# Patient Record
Sex: Female | Born: 1985
Health system: Southern US, Community
[De-identification: ages and names within clinical notes are randomized; demographics above are authoritative.]

## PROBLEM LIST (undated history)

## (undated) DIAGNOSIS — R87629 Unspecified abnormal cytological findings in specimens from vagina: Secondary | ICD-10-CM

## (undated) DIAGNOSIS — D649 Anemia, unspecified: Secondary | ICD-10-CM

## (undated) DIAGNOSIS — R112 Nausea with vomiting, unspecified: Secondary | ICD-10-CM

## (undated) DIAGNOSIS — Z9889 Other specified postprocedural states: Secondary | ICD-10-CM

## (undated) HISTORY — DX: Anemia, unspecified: D64.9

## (undated) HISTORY — PX: ANTERIOR CRUCIATE LIGAMENT REPAIR: SHX115

## (undated) HISTORY — DX: Unspecified abnormal cytological findings in specimens from vagina: R87.629

---

## 2020-03-13 NOTE — L&D Delivery Note (Signed)
PROCEDURE DATE: 10/12/2020   PREOPERATIVE DIAGNOSIS: [redacted]w[redacted]d, PROM, IUGR, breech   POSTOPERATIVE DIAGNOSIS: The same   PROCEDURE:    PrimaryLow Transverse Cesarean Section   SURGEON:  Dr. Austin Kilie Rund   INDICATIONS: This is a 35yo G1P0 at 37 wga requiring cesarean section secondary to ruptured membranes and breech presentation.  Pregnancy also notable for IUGR with normal doppler studies. Decision made to proceed with LTCS. The risks of cesarean section discussed with the patient included but were not limited to: bleeding which may require transfusion or reoperation; infection which may require antibiotics; injury to bowel, bladder, ureters or other surrounding organs; injury to the fetus; need for additional procedures including hysterectomy in the event of a life-threatening hemorrhage; placental abnormalities wth subsequent pregnancies, incisional problems, thromboembolic phenomenon and other postoperative/anesthesia complications. The patient agreed with the proposed plan, giving informed consent for the procedure.     FINDINGS:  Viable female infant in breech presentation, APGARspend,  Weight pending, Amniotic fluid clear,  Intact placenta, three vessel cord.  Grossly normal uterus. .   ANESTHESIA:    Epidural ESTIMATED BLOOD LOSS: 500 cc SPECIMENS: Placenta for pathology (IUGR) COMPLICATIONS: None immediate    PROCEDURE IN DETAIL:  The patient received intravenous antibiotics (2g Ancef) and had sequential compression devices applied to her lower extremities while in the preoperative area.  She was then taken to the operating room where epidural anesthesia was dosed up to surgical level and was found to be adequate. She was then placed in a dorsal supine position with a leftward tilt, and prepped and draped in a sterile manner.  A foley catheter was placed into her bladder and attached to constant gravity.  After an adequate timeout was performed, a Pfannenstiel skin incision was made with  scalpel and carried through to the underlying layer of fascia. The fascia was incised in the midline and this incision was extended bilaterally using the Mayo scissors. Kocher clamps were applied to the superior aspect of the fascial incision and the underlying rectus muscles were dissected off bluntly. A similar process was carried out on the inferior aspect of the facial incision. The rectus muscles were separated in the midline bluntly and the peritoneum was entered bluntly.  A bladder flap was created sharply and developed bluntly. A transverse hysterotomy was made with a scalpel and extended bilaterally bluntly. The bladder blade was then removed. The infant was successfully delivered, and cord was clamped and cut and infant was handed over to awaiting neonatology team. Uterine massage was then administered and the placenta delivered intact with three-vessel cord. Cord gases were taken. The uterus was cleared of clot and debris.  The hysterotomy was closed with 0 monocryl.  A second imbricating suture of 0-monocryl was used to reinforce the incision and aid in hemostasis.The fascia was closed with 0-Vicryl in a running fashion with good restoration of anatomy.  The subcutaneus tissue was irrigated and was reapproximated using three interrupted plain gut stitches.  The skin was closed with 4-0 Vicryl in a subcuticular fashion.  All surgical site and was hemostatic at end of procedure without any further bleeding on exam.    Pt tolerated the procedure well. All sponge/lap/needle counts were correct  X 2. Pt taken to recovery room in stable condition.   Austin Ario Mcdiarmid MD  

## 2020-03-26 ENCOUNTER — Other Ambulatory Visit (HOSPITAL_COMMUNITY): Payer: Self-pay | Admitting: Obstetrics and Gynecology

## 2020-03-26 DIAGNOSIS — D649 Anemia, unspecified: Secondary | ICD-10-CM | POA: Insufficient documentation

## 2020-03-26 DIAGNOSIS — N911 Secondary amenorrhea: Secondary | ICD-10-CM | POA: Diagnosis not present

## 2020-03-26 MED FILL — PROMETHAZINE 25 MG TABLET: 25 | 10 days supply | Qty: 60 | Fill #0

## 2020-03-26 MED FILL — DOXYLAMINE-PYRIDOXINE 10-10: 10-10 | 30 days supply | Qty: 60 | Fill #0

## 2020-04-08 DIAGNOSIS — Z3685 Encounter for antenatal screening for Streptococcus B: Secondary | ICD-10-CM | POA: Diagnosis not present

## 2020-04-08 DIAGNOSIS — Z3481 Encounter for supervision of other normal pregnancy, first trimester: Secondary | ICD-10-CM | POA: Diagnosis not present

## 2020-04-08 DIAGNOSIS — Z31430 Encounter of female for testing for genetic disease carrier status for procreative management: Secondary | ICD-10-CM | POA: Diagnosis not present

## 2020-04-19 DIAGNOSIS — Z113 Encounter for screening for infections with a predominantly sexual mode of transmission: Secondary | ICD-10-CM | POA: Diagnosis not present

## 2020-04-19 DIAGNOSIS — Z3400 Encounter for supervision of normal first pregnancy, unspecified trimester: Secondary | ICD-10-CM | POA: Diagnosis not present

## 2020-04-19 DIAGNOSIS — Z01419 Encounter for gynecological examination (general) (routine) without abnormal findings: Secondary | ICD-10-CM | POA: Diagnosis not present

## 2020-04-27 DIAGNOSIS — Z3A13 13 weeks gestation of pregnancy: Secondary | ICD-10-CM | POA: Diagnosis not present

## 2020-04-27 DIAGNOSIS — Z3481 Encounter for supervision of other normal pregnancy, first trimester: Secondary | ICD-10-CM | POA: Diagnosis not present

## 2020-04-27 DIAGNOSIS — Z3682 Encounter for antenatal screening for nuchal translucency: Secondary | ICD-10-CM | POA: Diagnosis not present

## 2020-05-24 DIAGNOSIS — M84375A Stress fracture, left foot, initial encounter for fracture: Secondary | ICD-10-CM | POA: Diagnosis not present

## 2020-05-25 ENCOUNTER — Encounter: Payer: Self-pay | Admitting: Family Medicine

## 2020-05-25 ENCOUNTER — Ambulatory Visit (INDEPENDENT_AMBULATORY_CARE_PROVIDER_SITE_OTHER): Payer: 59 | Admitting: Family Medicine

## 2020-05-25 ENCOUNTER — Other Ambulatory Visit: Payer: Self-pay

## 2020-05-25 ENCOUNTER — Ambulatory Visit: Payer: Self-pay

## 2020-05-25 VITALS — BP 108/62 | HR 60 | Ht <= 58 in | Wt 116.0 lb

## 2020-05-25 DIAGNOSIS — M79672 Pain in left foot: Secondary | ICD-10-CM | POA: Diagnosis not present

## 2020-05-25 NOTE — Patient Instructions (Signed)
Post-op shoe until I see you again 4000-5000IU of vitamin d Arnica lotion to the foot Ice after activity for 20 minutes Continue compression socks Tylenol for pain relief  See me in 2-3 weeks (ok to double book)

## 2020-05-25 NOTE — Progress Notes (Signed)
[facility] [address] [place] [address] Phone: [phone] Subjective:   [name], am serving as a scribe for Dr. Antoine Primas. This visit occurred during the SARS-CoV-2 public health emergency.  Safety protocols were in place, including screening questions prior to the visit, additional usage of staff PPE, and extensive cleaning of exam room while observing appropriate contact time as indicated for disinfecting solutions.   I'm seeing this patient by the request  of:  Patient, No Pcp Per  CC: Left foot pain  [id number]  Debra Sawyer is a 35 y.o. female coming in with complaint of left foot injury. Patent was running on treadmill and felt sharp pain on top of left foot one week ago. Soreness with planter flexion over lateral aspect. Patient was on feet over the weekend and pain seemed to increase. Had xray yesterday and was put in a boot due to pain. No history of foot pain. Patient works out 7 days a week. Was doing 4-8 miles on treadmill. Is pregnant and has to run to be able to be less constipated.  Patient has not taken anything for pain.  Past surgical history includes for ACL repair    Social History   Socioeconomic History  . Marital status: Married    Spouse name: Not on file  . Number of children: Not on file  . Years of education: Not on file  . Highest education level: Not on file  Occupational History  . Not on file  Tobacco Use  . Smoking status: Not on file  . Smokeless tobacco: Not on file  Substance and Sexual Activity  . Alcohol use: Not on file  . Drug use: Not on file  . Sexual activity: Not on file  Other Topics Concern  . Not on file  Social History Narrative  . Not on file   Social Determinants of Health   Financial Resource Strain: Not on file  Food Insecurity: Not on file  Transportation Needs: Not on file  Physical Activity: Not on file  Stress: Not on file  Social Connections: Not on  file   Allergies  Allergen Reactions  . Sulfa Antibiotics Rash   No family history on file. No current outpatient medications on file.   Reviewed prior external information including notes and imaging from  primary care provider As well as notes that were available from care everywhere and other healthcare systems.  Past medical history, social, surgical and family history all reviewed in electronic medical record.  No pertanent information unless stated regarding to the chief complaint.   Review of Systems:  No headache, visual changes, nausea, vomiting, diarrhea, constipation, dizziness, abdominal pain, skin rash, fevers, chills, night sweats, weight loss, swollen lymph nodes, body aches,, chest pain, shortness of breath, mood changes. POSITIVE muscle aches mild joint swelling  Objective  Blood pressure 108/62, pulse 60, height 1' (0.305 m), weight 116 lb (52.6 kg), SpO2 98 %.   General: No apparent distress alert and oriented x3 mood and affect normal, dressed appropriately.  HEENT: Pupils equal, extraocular movements intact  Respiratory: Patient's speak in full sentences and does not appear short of breath  Cardiovascular: No lower extremity edema, non tender, no erythema  Gait antalgic gait walking in a long boot. MSK: Left foot exam shows patient does have very mild amount of fluid on the dorsal aspect of the foot.  Severely tender over the third metatarsal.  No pain in the midfoot.  Neurovascularly intact  distally.  Patient on the plantar aspect of the foot has significant number of callus.  Patient does have 1 about the area of the third foot proximally at the transverse arch.  Large callus formation noted over the first and second on the plantar aspect of the foot.  Mild bunion formation noted.  Limited musculoskeletal ultrasound was performed and interpreted by Judi Saa  Limited ultrasound of patient's left dorsal foot does not show any significant cortical irregularity  of the third or fourth metatarsal bones.  Patient does have some soft tissue swelling and mild enlargement of the nerve between the third and fourth but no true neuroma noted.  Mild increase in Doppler flow in the area.  Seems to be compressible today. Impression: Nonspecific soft tissue inflammation between the third and fourth metatarsals.    Impression and Recommendations:     The above documentation has been reviewed and is accurate and complete Judi Saa, DO

## 2020-05-25 NOTE — Assessment & Plan Note (Addendum)
Patient does have dorsal pain of the foot.  Patient does have significant callus formation on the bottom foot.  Could be potentially a stress reaction.  On ultrasound I do not see any true stress reaction the patient does have soft tissue swelling noted over the third and fourth metatarsals.  Patient is pregnant and could have some laxity of the joint that could be contributing.  I do not see any midfoot difficulties noted either in the Lisfranc area.  Patient was put in an postop shoe and did have significant improvement from patient's long cam walker that she was able from outside facility.  Not have x-rays but we will hold at this time.  Patient will follow up with me again in 2 to 3 weeks.  Encourage vitamin D supplementation.

## 2020-05-27 ENCOUNTER — Ambulatory Visit: Payer: Self-pay | Admitting: Family Medicine

## 2020-06-02 DIAGNOSIS — Z3A18 18 weeks gestation of pregnancy: Secondary | ICD-10-CM | POA: Diagnosis not present

## 2020-06-02 DIAGNOSIS — Z363 Encounter for antenatal screening for malformations: Secondary | ICD-10-CM | POA: Diagnosis not present

## 2020-06-02 DIAGNOSIS — Z3402 Encounter for supervision of normal first pregnancy, second trimester: Secondary | ICD-10-CM | POA: Diagnosis not present

## 2020-06-09 NOTE — Progress Notes (Signed)
[name] [address] [place] [address] Phone: [phone] Subjective:   I Debra Sawyer am serving as a Neurosurgeon for Dr. Antoine Primas.  This visit occurred during the SARS-CoV-2 public health emergency.  Safety protocols were in place, including screening questions prior to the visit, additional usage of staff PPE, and extensive cleaning of exam room while observing appropriate contact time as indicated for disinfecting solutions.   I'm seeing this patient by the request  of:  Patient, No Pcp Per (Inactive)  CC: Left foot pain follow-up  [id number]   05/25/2020 Patient does have dorsal pain of the foot.  Patient does have significant callus formation on the bottom foot.  Could be potentially a stress reaction.  On ultrasound I do not see any true stress reaction the patient does have soft tissue swelling noted over the third and fourth metatarsals.  Patient is pregnant and could have some laxity of the joint that could be contributing.  I do not see any midfoot difficulties noted either in the Lisfranc area.  Patient was put in an postop shoe and did have significant improvement from patient's long cam walker that she was able from outside facility.  Not have x-rays but we will hold at this time.  Patient will follow up with me again in 2 to 3 weeks.  Encourage vitamin D supplementation.  Update 06/10/2020 Debra Sawyer is a 35 y.o. female coming in with complaint of left foot pain.  Patient is pregnant.  Was found to have likely a stress reaction third and fourth metatarsal heads. States she is doing better but not 100%. About 50% better. Can't walk without a shoe. A lot of pain with walking. With a shoe on it is bearable. States she limps with a shoe off.      No past medical history on file. No past surgical history on file. Social History   Socioeconomic History  . Marital status: Married    Spouse name: Not on file  . Number of  children: Not on file  . Years of education: Not on file  . Highest education level: Not on file  Occupational History  . Not on file  Tobacco Use  . Smoking status: Not on file  . Smokeless tobacco: Not on file  Substance and Sexual Activity  . Alcohol use: Not on file  . Drug use: Not on file  . Sexual activity: Not on file  Other Topics Concern  . Not on file  Social History Narrative  . Not on file   Social Determinants of Health   Financial Resource Strain: Not on file  Food Insecurity: Not on file  Transportation Needs: Not on file  Physical Activity: Not on file  Stress: Not on file  Social Connections: Not on file   Allergies  Allergen Reactions  . Sulfa Antibiotics Rash   No family history on file. No current outpatient medications on file.   Reviewed prior external information including notes and imaging from  primary care provider As well as notes that were available from care everywhere and other healthcare systems.  Past medical history, social, surgical and family history all reviewed in electronic medical record.  No pertanent information unless stated regarding to the chief complaint.   Review of Systems:  No headache, visual changes, nausea, vomiting, diarrhea, constipation, dizziness, abdominal pain, skin rash, fevers, chills, night sweats, weight loss, swollen lymph nodes, body aches, joint swelling, chest pain, shortness of breath, mood changes.  POSITIVE muscle aches  Objective  Blood pressure 100/62, pulse 70, height 5\' 2"  (1.575 m), weight 116 lb (52.6 kg), SpO2 100 %.   General: No apparent distress alert and oriented x3 mood and affect normal, dressed appropriately.  HEENT: Pupils equal, extraocular movements intact  Respiratory: Patient's speak in full sentences and does not appear short of breath  Cardiovascular: No lower extremity edema, non tender, no erythema  Gait normal with good balance and coordination.  MSK: Patient is gravid Foot  exam shows that patient is less tender than before but still has some pain over the fourth and third metatarsal proximally. Mild positive squeeze test noted.  Limited musculoskeletal ultrasound was performed and interpreted by  Unable to save pictures Patient does have what appears to be improvement over the cortical irregularity that was noted before.  Patient though does have what appears to be mild enlargement of the nerve between the third and fourth toe that is more than previous exam. Impression: Reactive neuroma with reabsorption of the cortical irregularity previously seen.   Impression and Recommendations:     The above documentation has been reviewed and is accurate and complete Judi Saa, DO

## 2020-06-10 ENCOUNTER — Encounter: Payer: Self-pay | Admitting: Family Medicine

## 2020-06-10 ENCOUNTER — Other Ambulatory Visit: Payer: Self-pay

## 2020-06-10 ENCOUNTER — Ambulatory Visit (INDEPENDENT_AMBULATORY_CARE_PROVIDER_SITE_OTHER): Payer: 59 | Admitting: Family Medicine

## 2020-06-10 DIAGNOSIS — M79672 Pain in left foot: Secondary | ICD-10-CM | POA: Diagnosis not present

## 2020-06-10 NOTE — Assessment & Plan Note (Signed)
Patient is already found out that she is having some reabsorption noted on ultrasound today.  Patient is unfortunately probably getting a reactive neuroma.  I would like patients of bone to be fully healed before we do injection before patient goes to [place].  We will see patient again in 2 to 3 weeks to see how patient is responding and consider injection if necessary.

## 2020-06-10 NOTE — Patient Instructions (Addendum)
OOOFOS sandals Continue Vit D Getting a neuroma which we can injection See me in 2-3 weeks

## 2020-06-29 NOTE — Progress Notes (Signed)
[name] [address] [place] [address] Phone: [phone] Subjective:   [name], am serving as a scribe for Dr. Antoine Primas. This visit occurred during the SARS-CoV-2 public health emergency.  Safety protocols were in place, including screening questions prior to the visit, additional usage of staff PPE, and extensive cleaning of exam room while observing appropriate contact time as indicated for disinfecting solutions.   I'm seeing this patient by the request  of:  Patient, No Pcp Per (Inactive)  CC: Foot pain follow-up  [id number]   06/10/2020 Patient is already found out that she is having some reabsorption noted on ultrasound today.  Patient is unfortunately probably getting a reactive neuroma.  I would like patients of bone to be fully healed before we do injection before patient goes to [place].  We will see patient again in 2 to 3 weeks to see how patient is responding and consider injection if necessary.  Update 06/30/2020 Debra Sawyer is a 35 y.o. female coming in with complaint of left foot pain. Patient states that she is improving. Still having pain barefoot. Less pani with a shoe. Overall improving though.  Patient does states that it is still not good enough that patient is not without some type of discomfort.  Not using any medications regularly though.      No past medical history on file. No past surgical history on file. Social History   Socioeconomic History  . Marital status: Married    Spouse name: Not on file  . Number of children: Not on file  . Years of education: Not on file  . Highest education level: Not on file  Occupational History  . Not on file  Tobacco Use  . Smoking status: Not on file  . Smokeless tobacco: Not on file  Substance and Sexual Activity  . Alcohol use: Not on file  . Drug use: Not on file  . Sexual activity: Not on file  Other Topics Concern  . Not on file  Social  History Narrative  . Not on file   Social Determinants of Health   Financial Resource Strain: Not on file  Food Insecurity: Not on file  Transportation Needs: Not on file  Physical Activity: Not on file  Stress: Not on file  Social Connections: Not on file   Allergies  Allergen Reactions  . Sulfa Antibiotics Rash   No family history on file.    Current Outpatient Medications (Respiratory):  .  promethazine (PHENERGAN) 25 MG tablet, TAKE 1 TABLET BY MOUTH EVERY 4 HOURS    Current Outpatient Medications (Other):  Marland Kitchen  Doxylamine-Pyridoxine 10-10 MG TBEC, TAKE 2 TABLETS BY MOUTH DAILY   Reviewed prior external information including notes and imaging from  primary care provider As well as notes that were available from care everywhere and other healthcare systems.  Past medical history, social, surgical and family history all reviewed in electronic medical record.  No pertanent information unless stated regarding to the chief complaint.   Review of Systems:  No headache, visual changes, nausea, vomiting, diarrhea, constipation, dizziness, abdominal pain, skin rash, fevers, chills, night sweats, weight loss, swollen lymph nodes, body aches, joint swelling, chest pain, shortness of breath, mood changes. POSITIVE muscle aches  Objective  Blood pressure 110/60, pulse 63, height 5\' 2"  (1.575 m), weight 119 lb (54 kg), SpO2 98 %.   General: No apparent distress alert and oriented x3 mood and affect normal, dressed appropriately.  Patient is  gravid  HEENT: Pupils equal, extraocular movements intact  Respiratory: Patient's speak in full sentences and does not appear short of breath  Cardiovascular: No lower extremity edema, non tender, no erythema  Gait normal with good balance and coordination.  MSK: Left foot examination of breakdown of the transverse arch noted.  Positive squeeze test noted.  Still some pain over the fourth and third metatarsal bones himself but only really the  third.   Procedure: Real-time Ultrasound Guided Injection of left foot neuroma Device: GE Logiq Q7 Ultrasound guided injection is preferred based studies that show increased duration, increased effect, greater accuracy, decreased procedural pain, increased response rate, and decreased cost with ultrasound guided versus blind injection.  Verbal informed consent obtained.  Time-out conducted.  Noted no overlying erythema, induration, or other signs of local infection.  Skin prepped in a sterile fashion.  Local anesthesia: Topical Ethyl chloride.  With sterile technique and under real time ultrasound guidance: With a 25-gauge half inch needle injected with 0.5 cc of 0.5% Marcaine and 0.5 cc of Kenalog 40 mg/mL Completed without difficulty  Pain immediately improved suggesting accurate placement of the medication.  Advised to call if fevers/chills, erythema, induration, drainage, or persistent bleeding.  Impression: Technically successful ultrasound guided injection.   Impression and Recommendations:     The above documentation has been reviewed and is accurate and complete Judi Saa, DO

## 2020-06-30 ENCOUNTER — Ambulatory Visit: Payer: Self-pay

## 2020-06-30 ENCOUNTER — Encounter: Payer: Self-pay | Admitting: Family Medicine

## 2020-06-30 ENCOUNTER — Ambulatory Visit (INDEPENDENT_AMBULATORY_CARE_PROVIDER_SITE_OTHER): Payer: 59 | Admitting: Family Medicine

## 2020-06-30 ENCOUNTER — Other Ambulatory Visit: Payer: Self-pay

## 2020-06-30 VITALS — BP 110/60 | HR 63 | Ht 62.0 in | Wt 119.0 lb

## 2020-06-30 DIAGNOSIS — G5782 Other specified mononeuropathies of left lower limb: Secondary | ICD-10-CM

## 2020-06-30 DIAGNOSIS — M79672 Pain in left foot: Secondary | ICD-10-CM

## 2020-06-30 NOTE — Assessment & Plan Note (Signed)
Discussed with patient at great length before the procedure.  Patient is pregnant and did not want to take the potential risk with this.  Patient has failed all other conservative therapy.  Patient did have a stress reaction that seems to be doing much better but now the neuroma seems to be giving her more of a discomfort.  Patient will continue to wear the shoes as well as the carbon fiber plate.  Patient will be traveling.  We discussed the potential for any redness or any inflammation to consider the possibility of medication such as antibiotic.  Patient states she will call if she notices anything.  Patient will follow up with me again in 6 weeks to further evaluate to make sure she is doing well.

## 2020-06-30 NOTE — Patient Instructions (Signed)
Injected neuroma today Have a great trip As long as things get better, can run at beginning of May 2x a week for 2 weeks  Then 3x a week for 3 weeks See me again in 6 weeks

## 2020-07-15 ENCOUNTER — Encounter: Payer: Self-pay | Admitting: Family Medicine

## 2020-08-05 DIAGNOSIS — Z3682 Encounter for antenatal screening for nuchal translucency: Secondary | ICD-10-CM | POA: Diagnosis not present

## 2020-08-05 DIAGNOSIS — Z23 Encounter for immunization: Secondary | ICD-10-CM | POA: Diagnosis not present

## 2020-08-05 DIAGNOSIS — Z3400 Encounter for supervision of normal first pregnancy, unspecified trimester: Secondary | ICD-10-CM | POA: Diagnosis not present

## 2020-08-06 NOTE — Progress Notes (Signed)
[facility] [address] [place] [address] Phone: [phone] Subjective:   Debra Sawyer, am serving as a scribe for Dr. Antoine Sawyer.\This visit occurred during the SARS-CoV-2 public health emergency.  Safety protocols were in place, including screening questions prior to the visit, additional usage of staff PPE, and extensive cleaning of exam room while observing appropriate contact time as indicated for disinfecting solutions. \ I'm seeing this patient by the request  of:  Patient, No Pcp Per (Inactive)  CC: Foot pain follow-up  SHF:[id number]   06/30/2020 Discussed with patient at great length before the procedure.  Patient is pregnant and did not want to take the potential risk with this.  Patient has failed all other conservative therapy.  Patient did have a stress reaction that seems to be doing much better but now the neuroma seems to be giving her more of a discomfort.  Patient will continue to wear the shoes as well as the carbon fiber plate.  Patient will be traveling.  We discussed the potential for any redness or any inflammation to consider the possibility of medication such as antibiotic.  Patient states she will call if she notices anything.  Patient will follow up with me again in 6 weeks to further evaluate to make sure she is doing well.  Update 08/10/2020 Debra Sawyer is a 35 y.o. female coming in with complaint of L foot pain.  Patient was seen previously and did have a neuroma of the left foot.  Did do an injection April 20.  Patient states that her foot is better.   Feeling pain in L medial longitudinal. Pain with running or is prolonged walking. Pain has increased with firm shoes.      No past medical history on file. No past surgical history on file. Social History   Socioeconomic History  . Marital status: Married    Spouse name: Not on file  . Number of children: Not on file  . Years of education: Not on file  .  Highest education level: Not on file  Occupational History  . Not on file  Tobacco Use  . Smoking status: Not on file  . Smokeless tobacco: Not on file  Substance and Sexual Activity  . Alcohol use: Not on file  . Drug use: Not on file  . Sexual activity: Not on file  Other Topics Concern  . Not on file  Social History Narrative  . Not on file   Social Determinants of Health   Financial Resource Strain: Not on file  Food Insecurity: Not on file  Transportation Needs: Not on file  Physical Activity: Not on file  Stress: Not on file  Social Connections: Not on file   Allergies  Allergen Reactions  . Sulfa Antibiotics Rash   No family history on file.    Current Outpatient Medications (Respiratory):  .  promethazine (PHENERGAN) 25 MG tablet, TAKE 1 TABLET BY MOUTH EVERY 4 HOURS    Current Outpatient Medications (Other):  Marland Kitchen  Doxylamine-Pyridoxine 10-10 MG TBEC, TAKE 2 TABLETS BY MOUTH DAILY   Reviewed prior external information including notes and imaging from  primary care provider As well as notes that were available from care everywhere and other healthcare systems.  Past medical history, social, surgical and family history all reviewed in electronic medical record.  No pertanent information unless stated regarding to the chief complaint.   Review of Systems:  No headache, visual changes, nausea, vomiting, diarrhea, constipation, dizziness,  abdominal pain, skin rash, fevers, chills, night sweats, weight loss, swollen lymph nodes, body aches, joint swelling, chest pain, shortness of breath, mood changes.   Objective  Blood pressure (!) 104/58, pulse 69, height 5\' 2"  (1.575 m), weight 121 lb (54.9 kg), SpO2 99 %.   General: No apparent distress alert and oriented x3 mood and affect normal, dressed appropriately.  Patient is gravid HEENT: Pupils equal, extraocular movements intact  Respiratory: Patient's speak in full sentences and does not appear short of breath   Cardiovascular: No lower extremity edema, non tender, no erythema  Gait appears that patient does have some mild weakness noted of the hip abductor with patient's left knee and crossing midline with her stride.  Patient also does not pushoff on the left foot secondary to likely compensation from her foot previously.  This is noted in the midfoot. MSK:  Foot exam shows patient does have some mild breakdown of the transverse arch.  Patient does have very mild overpronation on the right foot.  Patient does have very mild limited range of motion of the ankle noted left greater than right.  Patient's neurovascular intact distally.  Significant amount of discomfort with compression of the foot.  Limited musculoskeletal ultrasound was performed and interpreted by  Limited ultrasound shows the patient has very mild hypoechoic changes of the midfoot noted.  No significant cortical irregularity noted.  Patient's neuroma is significantly smaller than previous exam with significant decrease in hypoechoic changes surrounding the surrounding area. Impression: Interval improvement of the neuroma with nonspecific findings of the midfoot   Impression and Recommendations:     The above documentation has been reviewed and is accurate and complete Debra Saa, DO

## 2020-08-10 ENCOUNTER — Encounter: Payer: Self-pay | Admitting: Family Medicine

## 2020-08-10 ENCOUNTER — Ambulatory Visit (INDEPENDENT_AMBULATORY_CARE_PROVIDER_SITE_OTHER): Payer: 59 | Admitting: Family Medicine

## 2020-08-10 ENCOUNTER — Ambulatory Visit: Payer: Self-pay

## 2020-08-10 ENCOUNTER — Other Ambulatory Visit: Payer: Self-pay

## 2020-08-10 VITALS — BP 104/58 | HR 69 | Ht 62.0 in | Wt 121.0 lb

## 2020-08-10 DIAGNOSIS — M79672 Pain in left foot: Secondary | ICD-10-CM | POA: Diagnosis not present

## 2020-08-10 DIAGNOSIS — G5782 Other specified mononeuropathies of left lower limb: Secondary | ICD-10-CM | POA: Diagnosis not present

## 2020-08-10 NOTE — Assessment & Plan Note (Signed)
Seems significantly improved at this time.  Do not feel that a repeat injection would be necessary.  Continued for patient to be active we discussed potentially still more biking than anything else.  Follow-up again in 6 weeks

## 2020-08-10 NOTE — Patient Instructions (Signed)
Go back to regular shoes during the day Wear HOKA for running Try to push thru with running Ankle strengthening Update me in 2 weeks See me in 6 weeks

## 2020-08-10 NOTE — Assessment & Plan Note (Signed)
I believe the patient's left foot pain is secondary to more of some longitudinal breakdown.  Patient is pregnant.  We discussed with patient about icing regimen and home exercises.  Patient has been wearing the rigid soled shoes all day and encouraged her to switch to regular shoes during her work and go to more of the carbon fiber plate and running shoes with just running.  Patient given home exercises for more of the hip abductors as well as some for the ankle.  Patient is not pushing off on the ankle at the moment.  Discussed icing regimen and home exercises.  Follow-up with me again in 6 weeks

## 2020-09-06 ENCOUNTER — Other Ambulatory Visit (HOSPITAL_COMMUNITY): Payer: Self-pay | Admitting: Obstetrics and Gynecology

## 2020-09-06 DIAGNOSIS — R1900 Intra-abdominal and pelvic swelling, mass and lump, unspecified site: Secondary | ICD-10-CM

## 2020-09-07 ENCOUNTER — Other Ambulatory Visit (HOSPITAL_COMMUNITY): Payer: Self-pay | Admitting: Obstetrics and Gynecology

## 2020-09-07 DIAGNOSIS — R1900 Intra-abdominal and pelvic swelling, mass and lump, unspecified site: Secondary | ICD-10-CM

## 2020-09-15 ENCOUNTER — Other Ambulatory Visit: Payer: Self-pay

## 2020-09-15 ENCOUNTER — Ambulatory Visit (HOSPITAL_COMMUNITY)
Admission: RE | Admit: 2020-09-15 | Discharge: 2020-09-15 | Disposition: A | Discharge status: Home / Self Care | Payer: 59 | Source: Ambulatory Visit | Attending: Obstetrics and Gynecology | Admitting: Obstetrics and Gynecology

## 2020-09-15 DIAGNOSIS — R1900 Intra-abdominal and pelvic swelling, mass and lump, unspecified site: Secondary | ICD-10-CM | POA: Diagnosis not present

## 2020-09-15 DIAGNOSIS — R1909 Other intra-abdominal and pelvic swelling, mass and lump: Secondary | ICD-10-CM | POA: Diagnosis not present

## 2020-09-21 DIAGNOSIS — Z3A34 34 weeks gestation of pregnancy: Secondary | ICD-10-CM | POA: Diagnosis not present

## 2020-09-21 DIAGNOSIS — O26843 Uterine size-date discrepancy, third trimester: Secondary | ICD-10-CM | POA: Diagnosis not present

## 2020-09-21 DIAGNOSIS — Z3400 Encounter for supervision of normal first pregnancy, unspecified trimester: Secondary | ICD-10-CM | POA: Diagnosis not present

## 2020-09-24 ENCOUNTER — Other Ambulatory Visit: Payer: Self-pay | Admitting: Obstetrics and Gynecology

## 2020-09-24 DIAGNOSIS — Z3682 Encounter for antenatal screening for nuchal translucency: Secondary | ICD-10-CM | POA: Diagnosis not present

## 2020-09-24 DIAGNOSIS — O99891 Other specified diseases and conditions complicating pregnancy: Secondary | ICD-10-CM | POA: Diagnosis not present

## 2020-09-24 DIAGNOSIS — Z363 Encounter for antenatal screening for malformations: Secondary | ICD-10-CM

## 2020-09-24 DIAGNOSIS — Z3A34 34 weeks gestation of pregnancy: Secondary | ICD-10-CM | POA: Diagnosis not present

## 2020-09-24 DIAGNOSIS — Z3400 Encounter for supervision of normal first pregnancy, unspecified trimester: Secondary | ICD-10-CM | POA: Diagnosis not present

## 2020-09-27 DIAGNOSIS — Z3A34 34 weeks gestation of pregnancy: Secondary | ICD-10-CM | POA: Diagnosis not present

## 2020-09-27 DIAGNOSIS — O365999 Maternal care for other known or suspected poor fetal growth, unspecified trimester, other fetus: Secondary | ICD-10-CM | POA: Diagnosis not present

## 2020-09-27 DIAGNOSIS — Z3400 Encounter for supervision of normal first pregnancy, unspecified trimester: Secondary | ICD-10-CM | POA: Diagnosis not present

## 2020-09-28 ENCOUNTER — Encounter: Payer: Self-pay | Admitting: *Deleted

## 2020-09-28 ENCOUNTER — Ambulatory Visit (HOSPITAL_BASED_OUTPATIENT_CLINIC_OR_DEPARTMENT_OTHER): Payer: 59 | Admitting: Obstetrics and Gynecology

## 2020-09-28 ENCOUNTER — Other Ambulatory Visit: Payer: Self-pay | Admitting: Obstetrics and Gynecology

## 2020-09-28 ENCOUNTER — Ambulatory Visit: Payer: 59 | Attending: Obstetrics and Gynecology

## 2020-09-28 ENCOUNTER — Ambulatory Visit: Payer: 59 | Admitting: *Deleted

## 2020-09-28 ENCOUNTER — Other Ambulatory Visit: Payer: Self-pay

## 2020-09-28 VITALS — BP 107/62 | HR 61

## 2020-09-28 DIAGNOSIS — Z3A35 35 weeks gestation of pregnancy: Secondary | ICD-10-CM | POA: Insufficient documentation

## 2020-09-28 DIAGNOSIS — O365930 Maternal care for other known or suspected poor fetal growth, third trimester, not applicable or unspecified: Secondary | ICD-10-CM | POA: Diagnosis not present

## 2020-09-28 DIAGNOSIS — Z363 Encounter for antenatal screening for malformations: Secondary | ICD-10-CM

## 2020-09-28 NOTE — Progress Notes (Addendum)
Maternal-Fetal Medicine   Name: Debra Sawyer DOB: 03/24/1985 MRN: [id number] Referring Provider: Belva Agee, MD   I had the pleasure of seeing Ms. Gambale [facility] at [facility]. She was accompanied by her husband.  She is G1 P0 at 35-weeks' gestation and is here for a second-opinion ultrasound and consultation.  At your office ultrasound, severe fetal growth restriction was detected, and the estimated fetal weight was at the 3rd percentile.  Prenatal course: On cell free fetal DNA screening, the risks of fetal aneuploidies are not increased.  Her blood pressures have been normal at prenatal visits.  She does not have gestational diabetes.  Mid-trimester fetal anatomical survey performed at your office was reported as normal.  Patient reports that CMV screening was negative.  Past medical history: No history of diabetes or hypertension or any chronic medical conditions. Past surgical history: Left knee surgery (ACL repair). Medications: Prenatal vitamins, vitamin D supplements. Allergies: Sulfa (rashes). Social history: Denies tobacco or drug or alcohol use.  She has been married 1 year and her husband is in good health. Family history: Father had pulmonary embolism twice.  Mother has hypothyroidism. GYN history: History of abnormal Pap smears (HPV) but no cervical surgeries.  No history of breast disease.  Ultrasound On today's ultrasound, amniotic fluid is normal and good fetal activity seen.  Breech presentation.  The estimated fetal weight is at the 13th percentile and the abdominal circumference measurement is at the 7th percentile.    Umbilical artery Doppler showed normal forward diastolic flow.  Antenatal testing is reassuring.  BPP 8/8.  Fetal anatomical survey appears normal but limited by advanced gestational age.  Intracranial structures appear and abdomen including liver appear normal with no evidence of calcifications.  Our concerns include: Fetal  growth restriction -I explained that fetal growth restriction is diagnosed if the estimated fetal weight and/or abdominal circumference measurement falls below the 10th percentile.  -It is difficult to differentiate a constitutionally small fetus from fetal growth restriction.  Patient's BMI is 19.9.  -Placental insufficiency is the most common cause of fetal growth restriction.  Other causes include fetal chromosomal anomaly or genetic syndromes, maternal infections (rare) and maternal medical conditions.  -Regardless of diagnosis of fetal growth restriction, we recommend weekly antenatal testing from now till delivery.  -Timing of delivery: Provided antenatal testing remains reassuring, we recommend delivery at 52- or 39-weeks' gestation.  Patient has an option to deliver at [redacted] weeks gestation.  Delivery at 39 weeks as opposed to [redacted] weeks gestation slightly decreases neonatal respiratory distress syndrome or NICU admission.  -Vaginal delivery is not contraindicated.  Oxytocin infusion may be given first to assess fetal status before resorting to prostaglandins.  After counseling, patient informed that she would consider delivery at [redacted] weeks gestation.  She prefers weekly antenatal testing to be performed at your office.  Recommendations -Weekly BPP and UA Doppler till delivery at your office. -Delivery at [redacted] weeks gestation. -Earlier delivery to be considered if abnormal Doppler studies of detected. -Oxytocin infusion may be considered first to assess fetal status for induction of labor before prostaglandins.  Thank you for consultation.  If you have any questions or concerns, please contact me the [facility].  Consultation including face-to-face (more than 50%) counseling 30 minutes.

## 2020-09-29 NOTE — Progress Notes (Deleted)
[name] [address] [place] [address] Phone: [phone] Subjective:    I'm seeing this patient by the request  of:  Patient, No Pcp Per (Inactive)  CC: Foot pain and back pain follow-up  PRF:[id number]  08/10/2020 Seems significantly improved at this time.  Do not feel that a repeat injection would be necessary.  Continued for patient to be active we discussed potentially still more biking than anything else.  Follow-up again in 6 weeks  I believe the patient's left foot pain is secondary to more of some longitudinal breakdown.  Patient is pregnant.  We discussed with patient about icing regimen and home exercises.  Patient has been wearing the rigid soled shoes all day and encouraged her to switch to regular shoes during her work and go to more of the carbon fiber plate and running shoes with just running.  Patient given home exercises for more of the hip abductors as well as some for the ankle.  Patient is not pushing off on the ankle at the moment.  Discussed icing regimen and home exercises.  Follow-up with me again in 6 weeks  Update 09/30/2020 Chandra Asher is a 35 y.o. female coming in with complaint of L foot pain. Patient states   Patient is an [redacted] weeks gestation.  Patient has some mild difficulty with possible intrauterine growth restriction.  Fetal weight is somewhere between 3 and 13th percentile.  Patient is now undergoing weekly BPP with a questionable delivery at 38 weeks    Past Medical History:  Diagnosis Date   Anemia    Vaginal Pap smear, abnormal    Past Surgical History:  Procedure Laterality Date   ANTERIOR CRUCIATE LIGAMENT REPAIR Left    Social History   Socioeconomic History   Marital status: Married    Spouse name: Not on file   Number of children: Not on file   Years of education: Not on file   Highest education level: Not on file  Occupational History   Not on file  Tobacco Use   Smoking status: Never    Smokeless tobacco: Never  Vaping Use   Vaping Use: Never used  Substance and Sexual Activity   Alcohol use: Not Currently   Drug use: Never   Sexual activity: Not on file  Other Topics Concern   Not on file  Social History Narrative   Not on file   Social Determinants of Health   Financial Resource Strain: Not on file  Food Insecurity: Not on file  Transportation Needs: Not on file  Physical Activity: Not on file  Stress: Not on file  Social Connections: Not on file   Allergies  Allergen Reactions   Sulfa Antibiotics Rash   Family History  Problem Relation Age of Onset   Cancer Maternal Grandmother    Cancer Maternal Grandfather    Stroke Paternal Grandmother       Current Outpatient Medications (Respiratory):    promethazine (PHENERGAN) 25 MG tablet, TAKE 1 TABLET BY MOUTH EVERY 4 HOURS (Patient not taking: Reported on 09/28/2020)    Current Outpatient Medications (Other):    cholecalciferol (VITAMIN D3) 25 MCG (1000 UNIT) tablet, Take 2,000 Units by mouth daily.   Doxylamine-Pyridoxine 10-10 MG TBEC, TAKE 2 TABLETS BY MOUTH DAILY (Patient not taking: Reported on 09/28/2020)   Prenatal Vit-Fe Fumarate-FA (PRENATAL MULTIVITAMIN) TABS tablet, Take 1 tablet by mouth daily at 12 noon.   Reviewed prior external information including notes and imaging from  primary care provider As well as notes that were available from care everywhere and other healthcare systems.  Past medical history, social, surgical and family history all reviewed in electronic medical record.  No pertanent information unless stated regarding to the chief complaint.   Review of Systems:  No headache, visual changes, nausea, vomiting, diarrhea, constipation, dizziness, abdominal pain, skin rash, fevers, chills, night sweats, weight loss, swollen lymph nodes, body aches, joint swelling, chest pain, shortness of breath, mood changes. POSITIVE muscle aches  Objective  Last menstrual period  01/27/2020.   General: No apparent distress alert and oriented x3 mood and affect normal, dressed appropriately.  HEENT: Pupils equal, extraocular movements intact  Respiratory: Patient's speak in full sentences and does not appear short of breath  Cardiovascular: No lower extremity edema, non tender, no erythema  Gait normal with good balance and coordination.  MSK:  Non tender with full range of motion and good stability and symmetric strength and tone of shoulders, elbows, wrist, hip, knee and ankles bilaterally.     Impression and Recommendations:     The above documentation has been reviewed and is accurate and complete Judi Saa, DO

## 2020-09-30 ENCOUNTER — Ambulatory Visit: Payer: 59 | Admitting: Family Medicine

## 2020-09-30 DIAGNOSIS — Z3A35 35 weeks gestation of pregnancy: Secondary | ICD-10-CM | POA: Diagnosis not present

## 2020-09-30 DIAGNOSIS — O365999 Maternal care for other known or suspected poor fetal growth, unspecified trimester, other fetus: Secondary | ICD-10-CM | POA: Diagnosis not present

## 2020-09-30 DIAGNOSIS — Z3403 Encounter for supervision of normal first pregnancy, third trimester: Secondary | ICD-10-CM | POA: Diagnosis not present

## 2020-10-04 DIAGNOSIS — Z3A35 35 weeks gestation of pregnancy: Secondary | ICD-10-CM | POA: Diagnosis not present

## 2020-10-04 DIAGNOSIS — O365999 Maternal care for other known or suspected poor fetal growth, unspecified trimester, other fetus: Secondary | ICD-10-CM | POA: Diagnosis not present

## 2020-10-04 DIAGNOSIS — Z3400 Encounter for supervision of normal first pregnancy, unspecified trimester: Secondary | ICD-10-CM | POA: Diagnosis not present

## 2020-10-07 DIAGNOSIS — O321XX9 Maternal care for breech presentation, other fetus: Secondary | ICD-10-CM | POA: Diagnosis not present

## 2020-10-07 DIAGNOSIS — Z3A36 36 weeks gestation of pregnancy: Secondary | ICD-10-CM | POA: Diagnosis not present

## 2020-10-07 DIAGNOSIS — Z3685 Encounter for antenatal screening for Streptococcus B: Secondary | ICD-10-CM | POA: Diagnosis not present

## 2020-10-07 DIAGNOSIS — Z3400 Encounter for supervision of normal first pregnancy, unspecified trimester: Secondary | ICD-10-CM | POA: Diagnosis not present

## 2020-10-08 NOTE — Patient Instructions (Signed)
Debra Sawyer  10/08/2020   Your procedure is scheduled on:  10/20/2020  Arrive at 1100 at Entrance C on [address] at [facility]  and [employer]. You are invited to use the FREE valet parking or use the Visitor's parking deck.  Pick up the phone at the desk and dial [phone].  Call this number if you have problems the morning of surgery: [phone]  Remember:   Do not eat food:(After Midnight) Desps de medianoche.  Do not drink clear liquids: (After Midnight) Desps de medianoche.  Take these medicines the morning of surgery with A SIP OF WATER:  none   Do not wear jewelry, make-up or nail polish.  Do not wear lotions, powders, or perfumes. Do not wear deodorant.  Do not shave 48 hours prior to surgery.  Do not bring valuables to the hospital.  [facility] is not   responsible for any belongings or valuables brought to the hospital.  Contacts, dentures or bridgework may not be worn into surgery.  Leave suitcase in the car. After surgery it may be brought to your room.  For patients admitted to the hospital, checkout time is 11:00 AM the day of              discharge.      Please read over the following fact sheets that you were given:     Preparing for Surgery

## 2020-10-10 DIAGNOSIS — Z20822 Contact with and (suspected) exposure to covid-19: Secondary | ICD-10-CM | POA: Diagnosis not present

## 2020-10-11 ENCOUNTER — Encounter (HOSPITAL_COMMUNITY): Payer: Self-pay

## 2020-10-11 NOTE — Patient Instructions (Signed)
Audra Kagel  10/11/2020   Your procedure is scheduled on:  10/22/2020  Arrive at 1130 at [employer] on [address] at [facility]  and [employer]. You are invited to use the FREE valet parking or use the Visitor's parking deck.  Pick up the phone at the desk and dial [phone].  Call this number if you have problems the morning of surgery: [phone]  Remember:   Do not eat food:(After Midnight) Desps de medianoche.  Do not drink clear liquids: (After Midnight) Desps de medianoche.  Take these medicines the morning of surgery with A SIP OF WATER:  none   Do not wear jewelry, make-up or nail polish.  Do not wear lotions, powders, or perfumes. Do not wear deodorant.  Do not shave 48 hours prior to surgery.  Do not bring valuables to the hospital.  [facility] is not   responsible for any belongings or valuables brought to the hospital.  Contacts, dentures or bridgework may not be worn into surgery.  Leave suitcase in the car. After surgery it may be brought to your room.  For patients admitted to the hospital, checkout time is 11:00 AM the day of              discharge.      Please read over the following fact sheets that you were given:     Preparing for Surgery

## 2020-10-12 ENCOUNTER — Encounter (HOSPITAL_COMMUNITY): Payer: Self-pay | Admitting: Obstetrics & Gynecology

## 2020-10-12 ENCOUNTER — Other Ambulatory Visit: Payer: Self-pay

## 2020-10-12 ENCOUNTER — Inpatient Hospital Stay (HOSPITAL_COMMUNITY): Payer: 59 | Admitting: Anesthesiology

## 2020-10-12 ENCOUNTER — Inpatient Hospital Stay (HOSPITAL_COMMUNITY)
Admission: AD | Admit: 2020-10-12 | Discharge: 2020-10-14 | DRG: 786 | Disposition: A | Discharge status: Home / Self Care | Payer: 59 | Attending: Obstetrics and Gynecology | Admitting: Obstetrics and Gynecology

## 2020-10-12 ENCOUNTER — Encounter (HOSPITAL_COMMUNITY)
Admission: AD | Disposition: A | Discharge status: Home / Self Care | Payer: Self-pay | Source: Home / Self Care | Attending: Obstetrics and Gynecology

## 2020-10-12 DIAGNOSIS — U071 COVID-19: Secondary | ICD-10-CM | POA: Diagnosis not present

## 2020-10-12 DIAGNOSIS — O9852 Other viral diseases complicating childbirth: Secondary | ICD-10-CM | POA: Diagnosis present

## 2020-10-12 DIAGNOSIS — Z3A00 Weeks of gestation of pregnancy not specified: Secondary | ICD-10-CM | POA: Diagnosis not present

## 2020-10-12 DIAGNOSIS — Z3490 Encounter for supervision of normal pregnancy, unspecified, unspecified trimester: Secondary | ICD-10-CM

## 2020-10-12 DIAGNOSIS — O321XX0 Maternal care for breech presentation, not applicable or unspecified: Secondary | ICD-10-CM | POA: Diagnosis not present

## 2020-10-12 DIAGNOSIS — O4292 Full-term premature rupture of membranes, unspecified as to length of time between rupture and onset of labor: Secondary | ICD-10-CM | POA: Diagnosis present

## 2020-10-12 DIAGNOSIS — Z3A37 37 weeks gestation of pregnancy: Secondary | ICD-10-CM | POA: Diagnosis not present

## 2020-10-12 DIAGNOSIS — O9902 Anemia complicating childbirth: Secondary | ICD-10-CM | POA: Diagnosis not present

## 2020-10-12 DIAGNOSIS — O365930 Maternal care for other known or suspected poor fetal growth, third trimester, not applicable or unspecified: Secondary | ICD-10-CM | POA: Diagnosis not present

## 2020-10-12 DIAGNOSIS — O365990 Maternal care for other known or suspected poor fetal growth, unspecified trimester, not applicable or unspecified: Secondary | ICD-10-CM

## 2020-10-12 DIAGNOSIS — D649 Anemia, unspecified: Secondary | ICD-10-CM | POA: Diagnosis not present

## 2020-10-12 LAB — CBC
HCT: 43.5 % (ref 36.0–46.0)
Hemoglobin: 14.8 g/dL (ref 12.0–15.0)
MCH: 33.6 pg (ref 26.0–34.0)
MCHC: 34 g/dL (ref 30.0–36.0)
MCV: 98.6 fL (ref 80.0–100.0)
Platelets: 171 10*3/uL (ref 150–400)
RBC: 4.41 MIL/uL (ref 3.87–5.11)
RDW: 11.7 % (ref 11.5–15.5)
WBC: 11 10*3/uL — ABNORMAL HIGH (ref 4.0–10.5)
nRBC: 0 % (ref 0.0–0.2)

## 2020-10-12 LAB — TYPE AND SCREEN
ABO/RH(D): A POS
Antibody Screen: NEGATIVE

## 2020-10-12 LAB — RPR: RPR Ser Ql: NONREACTIVE

## 2020-10-12 LAB — POCT FERN TEST: POCT Fern Test: POSITIVE

## 2020-10-12 SURGERY — Surgical Case
Anesthesia: Spinal | Wound class: Clean Contaminated

## 2020-10-12 MED ORDER — CEFAZOLIN SODIUM-DEXTROSE 1-4 GM/50ML-% IV SOLN
1.0000 g | Freq: Once | INTRAVENOUS | Status: DC
  Filled 2020-10-12: qty 50

## 2020-10-12 MED ORDER — WITCH HAZEL-GLYCERIN EX PADS: 1.0000 "application " | MEDICATED_PAD | CUTANEOUS | Status: DC | PRN

## 2020-10-12 MED ORDER — BUPIVACAINE IN DEXTROSE 0.75-8.25 % IT SOLN
INTRATHECAL | Status: DC | PRN
  Administered 2020-10-12: 1.5 mL via INTRATHECAL

## 2020-10-12 MED ORDER — NALBUPHINE HCL 10 MG/ML IJ SOLN: 5.0000 mg | INTRAMUSCULAR | Status: DC | PRN

## 2020-10-12 MED ORDER — NALOXONE HCL 4 MG/10ML IJ SOLN
1.0000 ug/kg/h | INTRAVENOUS | Status: DC | PRN
  Filled 2020-10-12: qty 5

## 2020-10-12 MED ORDER — PHENYLEPHRINE HCL-NACL 20-0.9 MG/250ML-% IV SOLN
INTRAVENOUS | Status: DC | PRN
  Administered 2020-10-12: 60 ug/min via INTRAVENOUS

## 2020-10-12 MED ORDER — DIPHENHYDRAMINE HCL 25 MG PO CAPS: 25.0000 mg | ORAL_CAPSULE | ORAL | Status: DC | PRN

## 2020-10-12 MED ORDER — SENNOSIDES-DOCUSATE SODIUM 8.6-50 MG PO TABS
2.0000 | ORAL_TABLET | Freq: Every day | ORAL | Status: DC
  Administered 2020-10-13 – 2020-10-14 (×2): 2 via ORAL
  Filled 2020-10-12 (×2): qty 2

## 2020-10-12 MED ORDER — OXYTOCIN-SODIUM CHLORIDE 30-0.9 UT/500ML-% IV SOLN
INTRAVENOUS | Status: DC | PRN
  Administered 2020-10-12: 30 [IU] via INTRAVENOUS

## 2020-10-12 MED ORDER — FENTANYL CITRATE (PF) 100 MCG/2ML IJ SOLN
INTRAMUSCULAR | Status: DC | PRN
  Administered 2020-10-12: 15 ug via INTRATHECAL

## 2020-10-12 MED ORDER — DIPHENHYDRAMINE HCL 50 MG/ML IJ SOLN
12.5000 mg | INTRAMUSCULAR | Status: DC | PRN
  Administered 2020-10-12: 12.5 mg via INTRAVENOUS
  Filled 2020-10-12: qty 1

## 2020-10-12 MED ORDER — COCONUT OIL OIL: 1.0000 "application " | TOPICAL_OIL | Status: DC | PRN

## 2020-10-12 MED ORDER — SODIUM CHLORIDE 0.9 % IV SOLN
500.0000 mg | INTRAVENOUS | Status: DC
  Administered 2020-10-12: 500 mg via INTRAVENOUS
  Filled 2020-10-12: qty 500

## 2020-10-12 MED ORDER — ZOLPIDEM TARTRATE 5 MG PO TABS: 5.0000 mg | ORAL_TABLET | Freq: Every evening | ORAL | Status: DC | PRN

## 2020-10-12 MED ORDER — SODIUM CHLORIDE 0.9% FLUSH: 3.0000 mL | INTRAVENOUS | Status: DC | PRN

## 2020-10-12 MED ORDER — NALBUPHINE HCL 10 MG/ML IJ SOLN
INTRAMUSCULAR | Status: AC
  Filled 2020-10-12: qty 1

## 2020-10-12 MED ORDER — PRENATAL MULTIVITAMIN CH
1.0000 | ORAL_TABLET | Freq: Every day | ORAL | Status: DC
  Administered 2020-10-13 – 2020-10-14 (×2): 1 via ORAL
  Filled 2020-10-12 (×2): qty 1

## 2020-10-12 MED ORDER — ACETAMINOPHEN 325 MG PO TABS
650.0000 mg | ORAL_TABLET | ORAL | Status: DC | PRN
  Administered 2020-10-12 – 2020-10-14 (×9): 650 mg via ORAL
  Filled 2020-10-12 (×9): qty 2

## 2020-10-12 MED ORDER — SIMETHICONE 80 MG PO CHEW
80.0000 mg | CHEWABLE_TABLET | Freq: Three times a day (TID) | ORAL | Status: DC
  Administered 2020-10-12 – 2020-10-14 (×5): 80 mg via ORAL
  Filled 2020-10-12 (×6): qty 1

## 2020-10-12 MED ORDER — NALOXONE HCL 0.4 MG/ML IJ SOLN: 0.4000 mg | INTRAMUSCULAR | Status: DC | PRN

## 2020-10-12 MED ORDER — SODIUM CHLORIDE 0.9 % IR SOLN
Status: DC | PRN
  Administered 2020-10-12: 1000 mL

## 2020-10-12 MED ORDER — PHENYLEPHRINE HCL-NACL 20-0.9 MG/250ML-% IV SOLN
INTRAVENOUS | Status: AC
  Filled 2020-10-12: qty 250

## 2020-10-12 MED ORDER — SCOPOLAMINE 1 MG/3DAYS TD PT72: 1.0000 | MEDICATED_PATCH | Freq: Once | TRANSDERMAL | Status: DC

## 2020-10-12 MED ORDER — ONDANSETRON HCL 4 MG/2ML IJ SOLN: 4.0000 mg | Freq: Once | INTRAMUSCULAR | Status: DC | PRN

## 2020-10-12 MED ORDER — FAMOTIDINE IN NACL 20-0.9 MG/50ML-% IV SOLN
20.0000 mg | Freq: Once | INTRAVENOUS | Status: AC
  Administered 2020-10-12: 20 mg via INTRAVENOUS
  Filled 2020-10-12: qty 50

## 2020-10-12 MED ORDER — DIPHENHYDRAMINE HCL 25 MG PO CAPS: 25.0000 mg | ORAL_CAPSULE | Freq: Four times a day (QID) | ORAL | Status: DC | PRN

## 2020-10-12 MED ORDER — SODIUM CHLORIDE 0.9 % IV SOLN
INTRAVENOUS | Status: AC
  Filled 2020-10-12: qty 500

## 2020-10-12 MED ORDER — DIBUCAINE (PERIANAL) 1 % EX OINT: 1.0000 "application " | TOPICAL_OINTMENT | CUTANEOUS | Status: DC | PRN

## 2020-10-12 MED ORDER — SIMETHICONE 80 MG PO CHEW
80.0000 mg | CHEWABLE_TABLET | ORAL | Status: DC | PRN
  Administered 2020-10-14: 80 mg via ORAL
  Filled 2020-10-12: qty 1

## 2020-10-12 MED ORDER — TETANUS-DIPHTH-ACELL PERTUSSIS 5-2.5-18.5 LF-MCG/0.5 IM SUSY: 0.5000 mL | PREFILLED_SYRINGE | Freq: Once | INTRAMUSCULAR | Status: DC

## 2020-10-12 MED ORDER — NALBUPHINE HCL 10 MG/ML IJ SOLN: 5.0000 mg | Freq: Once | INTRAMUSCULAR | Status: DC | PRN

## 2020-10-12 MED ORDER — LACTATED RINGERS IV BOLUS
1000.0000 mL | Freq: Once | INTRAVENOUS | Status: AC
  Administered 2020-10-12: 1000 mL via INTRAVENOUS

## 2020-10-12 MED ORDER — LACTATED RINGERS IV SOLN: INTRAVENOUS | Status: DC

## 2020-10-12 MED ORDER — MORPHINE SULFATE (PF) 0.5 MG/ML IJ SOLN
INTRAMUSCULAR | Status: AC
  Filled 2020-10-12: qty 10

## 2020-10-12 MED ORDER — IBUPROFEN 600 MG PO TABS
600.0000 mg | ORAL_TABLET | Freq: Four times a day (QID) | ORAL | Status: DC
  Administered 2020-10-12 – 2020-10-14 (×8): 600 mg via ORAL
  Filled 2020-10-12 (×8): qty 1

## 2020-10-12 MED ORDER — ONDANSETRON HCL 4 MG/2ML IJ SOLN
INTRAMUSCULAR | Status: DC | PRN
  Administered 2020-10-12: 4 mg via INTRAVENOUS

## 2020-10-12 MED ORDER — SOD CITRATE-CITRIC ACID 500-334 MG/5ML PO SOLN
30.0000 mL | Freq: Once | ORAL | Status: AC
  Administered 2020-10-12: 30 mL via ORAL
  Filled 2020-10-12: qty 30

## 2020-10-12 MED ORDER — NALBUPHINE HCL 10 MG/ML IJ SOLN
5.0000 mg | INTRAMUSCULAR | Status: DC | PRN
  Administered 2020-10-12 (×2): 5 mg via INTRAVENOUS
  Filled 2020-10-12: qty 1

## 2020-10-12 MED ORDER — CEFAZOLIN SODIUM-DEXTROSE 2-4 GM/100ML-% IV SOLN
2.0000 g | Freq: Three times a day (TID) | INTRAVENOUS | Status: DC
  Administered 2020-10-12: 2 g via INTRAVENOUS
  Filled 2020-10-12 (×3): qty 100

## 2020-10-12 MED ORDER — MENTHOL 3 MG MT LOZG: 1.0000 | LOZENGE | OROMUCOSAL | Status: DC | PRN

## 2020-10-12 MED ORDER — KETOROLAC TROMETHAMINE 30 MG/ML IJ SOLN
INTRAMUSCULAR | Status: AC
  Filled 2020-10-12: qty 1

## 2020-10-12 MED ORDER — KETOROLAC TROMETHAMINE 30 MG/ML IJ SOLN
30.0000 mg | Freq: Once | INTRAMUSCULAR | Status: AC | PRN
  Administered 2020-10-12: 30 mg via INTRAVENOUS

## 2020-10-12 MED ORDER — ONDANSETRON HCL 4 MG/2ML IJ SOLN
INTRAMUSCULAR | Status: AC
  Filled 2020-10-12: qty 2

## 2020-10-12 MED ORDER — OXYTOCIN-SODIUM CHLORIDE 30-0.9 UT/500ML-% IV SOLN: 2.5000 [IU]/h | INTRAVENOUS | Status: AC

## 2020-10-12 MED ORDER — FENTANYL CITRATE (PF) 100 MCG/2ML IJ SOLN
INTRAMUSCULAR | Status: AC
  Filled 2020-10-12: qty 2

## 2020-10-12 MED ORDER — ONDANSETRON HCL 4 MG/2ML IJ SOLN: 4.0000 mg | Freq: Three times a day (TID) | INTRAMUSCULAR | Status: DC | PRN

## 2020-10-12 MED ORDER — LACTATED RINGERS IV SOLN: INTRAVENOUS | Status: DC | PRN

## 2020-10-12 MED ORDER — OXYCODONE HCL 5 MG PO TABS
5.0000 mg | ORAL_TABLET | ORAL | Status: DC | PRN
  Administered 2020-10-13 (×2): 5 mg via ORAL
  Administered 2020-10-13: 10 mg via ORAL
  Administered 2020-10-13 – 2020-10-14 (×3): 5 mg via ORAL
  Filled 2020-10-12: qty 2
  Filled 2020-10-12 (×6): qty 1

## 2020-10-12 MED ORDER — MORPHINE SULFATE (PF) 0.5 MG/ML IJ SOLN
INTRAMUSCULAR | Status: DC | PRN
  Administered 2020-10-12: 150 ug via INTRATHECAL

## 2020-10-12 MED ORDER — MEPERIDINE HCL 25 MG/ML IJ SOLN: 6.2500 mg | INTRAMUSCULAR | Status: DC | PRN

## 2020-10-12 MED ORDER — STERILE WATER FOR IRRIGATION IR SOLN
Status: DC | PRN
  Administered 2020-10-12: 1000 mL

## 2020-10-12 SURGICAL SUPPLY — 35 items
BENZOIN TINCTURE PRP APPL 2/3 (GAUZE/BANDAGES/DRESSINGS) ×2 IMPLANT
CHLORAPREP W/TINT 26ML (MISCELLANEOUS) ×2 IMPLANT
CLAMP CORD UMBIL (MISCELLANEOUS) IMPLANT
CLOTH BEACON ORANGE TIMEOUT ST (SAFETY) ×2 IMPLANT
CLSR STERI-STRIP ANTIMIC 1/2X4 (GAUZE/BANDAGES/DRESSINGS) ×2 IMPLANT
DRSG OPSITE POSTOP 4X10 (GAUZE/BANDAGES/DRESSINGS) ×2 IMPLANT
ELECT REM PT RETURN 9FT ADLT (ELECTROSURGICAL) ×2
ELECTRODE REM PT RTRN 9FT ADLT (ELECTROSURGICAL) ×1 IMPLANT
EXTRACTOR VACUUM KIWI (MISCELLANEOUS) IMPLANT
GLOVE BIO SURGEON STRL SZ 6.5 (GLOVE) ×2 IMPLANT
GLOVE BIOGEL PI IND STRL 6.5 (GLOVE) ×1 IMPLANT
GLOVE BIOGEL PI IND STRL 7.0 (GLOVE) ×2 IMPLANT
GLOVE BIOGEL PI INDICATOR 6.5 (GLOVE) ×1
GLOVE BIOGEL PI INDICATOR 7.0 (GLOVE) ×2
GOWN STRL REUS W/TWL LRG LVL3 (GOWN DISPOSABLE) ×4 IMPLANT
KIT ABG SYR 3ML LUER SLIP (SYRINGE) ×2 IMPLANT
NEEDLE HYPO 25X5/8 SAFETYGLIDE (NEEDLE) ×2 IMPLANT
NS IRRIG 1000ML POUR BTL (IV SOLUTION) ×2 IMPLANT
PACK C SECTION WH (CUSTOM PROCEDURE TRAY) ×2 IMPLANT
PAD OB MATERNITY 4.3X12.25 (PERSONAL CARE ITEMS) ×2 IMPLANT
PENCIL SMOKE EVAC W/HOLSTER (ELECTROSURGICAL) ×2 IMPLANT
STRIP CLOSURE SKIN 1/2X4 (GAUZE/BANDAGES/DRESSINGS) ×2 IMPLANT
SUT PLAIN 0 NONE (SUTURE) IMPLANT
SUT PLAIN 2 0 (SUTURE) ×1
SUT PLAIN ABS 2-0 CT1 27XMFL (SUTURE) ×1 IMPLANT
SUT VIC AB 0 CT1 27 (SUTURE) ×1
SUT VIC AB 0 CT1 27XBRD ANBCTR (SUTURE) ×1 IMPLANT
SUT VIC AB 0 CT1 36 (SUTURE) ×2 IMPLANT
SUT VIC AB 0 CTX 36 (SUTURE) ×2
SUT VIC AB 0 CTX36XBRD ANBCTRL (SUTURE) ×2 IMPLANT
SUT VIC AB 4-0 KS 27 (SUTURE) ×2 IMPLANT
SUT VIC AB 4-0 PS2 27 (SUTURE) ×2 IMPLANT
TOWEL OR 17X24 6PK STRL BLUE (TOWEL DISPOSABLE) ×2 IMPLANT
TRAY FOLEY W/BAG SLVR 14FR LF (SET/KITS/TRAYS/PACK) IMPLANT
WATER STERILE IRR 1000ML POUR (IV SOLUTION) ×2 IMPLANT

## 2020-10-12 NOTE — MAU Note (Signed)
...  Debra Sawyer is a 35 y.o. at [redacted]w[redacted]d here in MAU reporting: water broke around 0300. Clear fluids, no odor. +FM. No VB. Endorses lower abdominal "discomfort" that she rates 2/10. States that baby is IUGR.   +COVID as of 10/10/2020.   Lab orders placed from triage: MAU Labor Evaluation

## 2020-10-12 NOTE — H&P (Signed)
OB History and Physical   Debra Sawyer is a 35 y.o. female G1P0 presenting for LOF at [redacted]w[redacted]d.  ROM reported aroun 0300.  Pregnancy history notable for persistent breech, IUGR with normal dopplers. She has seen MFM and undergone antenatal testing in office.  COVID positive by PCR on 7/31 with mild cough and sore throat.  Breech confirmed again with BSUS by MAU.  FHT reactive and reassuring but prolonged decel at 08:08 AM   OB History     Gravida  1   Para      Term      Preterm      AB      Living         SAB      IAB      Ectopic      Multiple      Live Births             Past Medical History:  Diagnosis Date   Anemia    PONV (postoperative nausea and vomiting)    Vaginal Pap smear, abnormal    Past Surgical History:  Procedure Laterality Date   ANTERIOR CRUCIATE LIGAMENT REPAIR Left    Family History: family history includes Cancer in her maternal grandfather and maternal grandmother; Pulmonary embolism in her father; Stroke in her paternal grandmother. Social History:  reports that she has never smoked. She has never used smokeless tobacco. She reports previous alcohol use. She reports that she does not use drugs.     Maternal Diabetes: No Genetic Screening: Normal Maternal Ultrasounds/Referrals: Normal Fetal Ultrasounds or other Referrals:  None Maternal Substance Abuse:  No Significant Maternal Medications:  None Significant Maternal Lab Results:  Group B Strep negative Other Comments:  None  Review of Systems denies fever, chills SOB CP. Mild cough and sore throat. History   Blood pressure 119/65, pulse 62, resp. rate 17, last menstrual period 01/27/2020, SpO2 100 %. Exam Physical Exam  Gen: alert, well appearing, no distress Chest: nonlabored breathing CV: no peripheral edema Abdomen: soft, nontender Ext: no evidence of DVT  Prenatal labs: ABO, Rh:   positive Antibody:   neg Rubella:  RI RPR:   NR HBsAg:   neg HIV:    neg GBS:   Negative 10/07/20  Assessment/Plan: Admit to Labor and Delivery Urgent C section called following deceleration. Known IUGR, breech Consent signed for C section.  Risks and benefits reviewed, which include but are not limited to bleeding, infection, damage to nearby organs. She accepts transfusion as needed. COVID positive. PPE as indicated  Lyn Henri 10/12/2020, 8:33 AM

## 2020-10-12 NOTE — Lactation Note (Addendum)
This note was copied from a baby's chart. Lactation Consultation Note  Patient Name: Debra Sawyer [id number] Date: 10/12/2020 Reason for consult: Initial assessment;Mother's request;Primapara;1st time breastfeeding;Early term 37-38.6wks;Infant < 6lbs Age:34 hours  Mom stated feeding goals are to feed her infant breast milk or donor milk at this time.  Infant not able to latch at this time. Infant holding tongue back, even with suck training not able to latch to breast.   LPTI guidelines reviewed.  Plan 1. To feed based on cues 8-12x in 24 her period no more than 3 hrs without an attempt.  2. Mom to offer breasts and if unable to latch offer EBM via spoon . 3. Dad pace bottle feed with extra slow flow nipple any EBM first followed by DBM 4. Mom pumping with dEBP q 3 hrs for 15 min  5 I and O sheet reviewed. 6. LC brochure of inpatient and outpatient services reviewed.  All questions answered during the visit.  Maternal Data Has patient been taught Hand Expression?: Yes Does the patient have breastfeeding experience prior to this delivery?: No  Feeding Mother's Current Feeding Choice: Breast Milk and Donor Milk Nipple Type: Extra Slow Flow  LATCH Score                    Lactation Tools Discussed/Used    Interventions Interventions: Breast feeding basics reviewed;Support pillows;Education;Assisted with latch;Position options;Skin to skin;Expressed milk;Breast massage;Hand express;DEBP;Breast compression;Adjust position  Discharge Pump: Personal WIC Program: No  Consult Status Consult Status: Follow-up Date: 10/13/20 Follow-up type: In-patient    Debra Magar  Sawyer 10/12/2020, 6:28 PM

## 2020-10-12 NOTE — MAU Provider Note (Signed)
S: Ms. Debra Sawyer is a 35 y.o. G1P0 at [redacted]w[redacted]d  who presents to MAU today complaining of leaking of fluid since 0300. She denies vaginal bleeding. She denies contractions (has some lower pelvic pressure/discomfort she rates 2/10). She reports normal fetal movement.    O: BP 119/65 (BP Location: Left Arm)   Pulse 62   Resp 17   LMP 01/27/2020   SpO2 100%  GENERAL: Well-developed, well-nourished female in no acute distress.  HEAD: Normocephalic, atraumatic.  CHEST: Normal effort of breathing, regular heart rate ABDOMEN: Soft, nontender, gravid PELVIC: Normal external female genitalia. Vagina is pink and rugated. Cervix with normal contour, no lesions. Normal discharge.  Positive pooling.   Cervical exam deferred. Bedside u/s done for presentation, baby remains breech presentation.    Fetal Monitoring: reactive Baseline: 130 Variability: moderate Accelerations: 15x15 Decelerations: none Contractions: UI  Initial fern test negative Fern test from sterile speculum exam positive  A: [facility] at [redacted]w[redacted]d  SROM  P: Report given to RN to contact MD on call for further instructions  Bernerd Limbo, CNM 10/12/2020 0800

## 2020-10-12 NOTE — Anesthesia Preprocedure Evaluation (Addendum)
Anesthesia Evaluation  Patient identified by MRN, date of birth, ID band Patient awake    Reviewed: Allergy & Precautions, NPO status , Patient's Chart, lab work & pertinent test results  History of Anesthesia Complications (+) PONV  Airway Mallampati: II  TM Distance: >3 FB Neck ROM: Full    Dental  (+) Teeth Intact   Pulmonary  COVID+   Pulmonary exam normal        Cardiovascular negative cardio ROS   Rhythm:Regular Rate:Normal     Neuro/Psych negative neurological ROS  negative psych ROS   GI/Hepatic negative GI ROS, Neg liver ROS,   Endo/Other  negative endocrine ROS  Renal/GU negative Renal ROS  negative genitourinary   Musculoskeletal   Abdominal (+)  Abdomen: soft.    Peds  Hematology  (+) anemia ,   Anesthesia Other Findings   Reproductive/Obstetrics (+) Pregnancy breech                             Anesthesia Physical Anesthesia Plan  ASA: 2  Anesthesia Plan: Spinal   Post-op Pain Management:    Induction:   PONV Risk Score and Plan: 3 and Ondansetron, Dexamethasone and Treatment may vary due to age or medical condition  Airway Management Planned: Natural Airway and Nasal Cannula  Additional Equipment: None  Intra-op Plan:   Post-operative Plan:   Informed Consent: I have reviewed the patients History and Physical, chart, labs and discussed the procedure including the risks, benefits and alternatives for the proposed anesthesia with the patient or authorized representative who has indicated his/her understanding and acceptance.     Dental advisory given  Plan Discussed with: CRNA  Anesthesia Plan Comments: (Lab Results      Component                Value               Date                      WBC                      11.0 (H)            10/12/2020                HGB                      14.8                10/12/2020                HCT                       43.5                10/12/2020                MCV                      98.6                10/12/2020                PLT                      171  10/12/2020          )       Anesthesia Quick Evaluation

## 2020-10-12 NOTE — Transfer of Care (Signed)
Immediate Anesthesia Transfer of Care Note  Patient: Debra Sawyer  Procedure(s) Performed: PRIMARY CESAREAN SECTION EDC: 11-02-20 ALLERG: SULFA  Patient Location: PACU  Anesthesia Type:Spinal  Level of Consciousness: awake  Airway & Oxygen Therapy: Patient Spontanous Breathing  Post-op Assessment: Report given to RN and Post -op Vital signs reviewed and stable  Post vital signs: Reviewed and stable  Last Vitals:  Vitals Value Taken Time  BP    Temp    Pulse    Resp    SpO2      Last Pain:  Vitals:   10/12/20 0728  PainSc: 2          Complications: No notable events documented.

## 2020-10-12 NOTE — Anesthesia Procedure Notes (Signed)
Spinal  Patient location during procedure: OR Start time: 10/12/2020 10:12 AM End time: 10/12/2020 10:13 AM Staffing Performed: anesthesiologist  Anesthesiologist: Atilano Median, DO Preanesthetic Checklist Completed: patient identified, IV checked, site marked, risks and benefits discussed, surgical consent, monitors and equipment checked, pre-op evaluation and timeout performed Spinal Block Patient position: sitting Prep: DuraPrep Patient monitoring: heart rate, cardiac monitor, continuous pulse ox and blood pressure Approach: midline Location: L4-5 Injection technique: single-shot Needle Needle type: Pencan  Needle gauge: 24 G Needle length: 10 cm Assessment Events: CSF return Additional Notes Patient identified. Risks/Benefits/Options discussed with patient including but not limited to bleeding, infection, nerve damage, paralysis, failed block, incomplete pain control, headache, blood pressure changes, nausea, vomiting, reactions to medications, itching and postpartum back pain. Confirmed with bedside nurse the patient's most recent platelet count. Confirmed with patient that they are not currently taking any anticoagulation, have any bleeding history or any family history of bleeding disorders. Patient expressed understanding and wished to proceed. All questions were answered. Sterile technique was used throughout the entire procedure. Please see nursing notes for vital signs. Warning signs of high block given to the patient including shortness of breath, tingling/numbness in hands, complete motor block, or any concerning symptoms with instructions to call for help. Patient was given instructions on fall risk and not to get out of bed. All questions and concerns addressed with instructions to call with any issues or inadequate analgesia.

## 2020-10-12 NOTE — Anesthesia Postprocedure Evaluation (Signed)
Anesthesia Post Note  Patient: Debra Sawyer  Procedure(s) Performed: PRIMARY CESAREAN SECTION EDC: 11-02-20 ALLERG: SULFA     Patient location during evaluation: Mother Baby Anesthesia Type: Spinal Level of consciousness: oriented and awake and alert Pain management: pain level controlled Vital Signs Assessment: post-procedure vital signs reviewed and stable Respiratory status: spontaneous breathing and respiratory function stable Cardiovascular status: blood pressure returned to baseline and stable Postop Assessment: no headache, no backache, no apparent nausea or vomiting and able to ambulate Anesthetic complications: no   No notable events documented.  Last Vitals:  Vitals:   10/12/20 1245 10/12/20 1300  BP: 103/67 103/63  Pulse: (!) 54 (!) 54  Resp: 14 13  Temp:  36.6 C  SpO2: 100% 100%    Last Pain:  Vitals:   10/12/20 1300  TempSrc: Oral  PainSc: 0-No pain                 Earl Lites P Margurette Brener

## 2020-10-12 NOTE — Op Note (Signed)
PROCEDURE DATE: 10/12/2020   PREOPERATIVE DIAGNOSIS: [redacted]w[redacted]d, PROM, IUGR, breech   POSTOPERATIVE DIAGNOSIS: The same   PROCEDURE:    PrimaryLow Transverse Cesarean Section   SURGEON:  Dr. Nilda Simmer   INDICATIONS: This is a 35yo G1P0 at 26 wga requiring cesarean section secondary to ruptured membranes and breech presentation.  Pregnancy also notable for IUGR with normal doppler studies. Decision made to proceed with LTCS. The risks of cesarean section discussed with the patient included but were not limited to: bleeding which may require transfusion or reoperation; infection which may require antibiotics; injury to bowel, bladder, ureters or other surrounding organs; injury to the fetus; need for additional procedures including hysterectomy in the event of a life-threatening hemorrhage; placental abnormalities wth subsequent pregnancies, incisional problems, thromboembolic phenomenon and other postoperative/anesthesia complications. The patient agreed with the proposed plan, giving informed consent for the procedure.     FINDINGS:  Viable female infant in breech presentation, APGARspend,  Weight pending, Amniotic fluid clear,  Intact placenta, three vessel cord.  Grossly normal uterus. .   ANESTHESIA:    Epidural ESTIMATED BLOOD LOSS: 500 cc SPECIMENS: Placenta for pathology (IUGR) COMPLICATIONS: None immediate    PROCEDURE IN DETAIL:  The patient received intravenous antibiotics (2g Ancef) and had sequential compression devices applied to her lower extremities while in the preoperative area.  She was then taken to the operating room where epidural anesthesia was dosed up to surgical level and was found to be adequate. She was then placed in a dorsal supine position with a leftward tilt, and prepped and draped in a sterile manner.  A foley catheter was placed into her bladder and attached to constant gravity.  After an adequate timeout was performed, a Pfannenstiel skin incision was made with  scalpel and carried through to the underlying layer of fascia. The fascia was incised in the midline and this incision was extended bilaterally using the Mayo scissors. Kocher clamps were applied to the superior aspect of the fascial incision and the underlying rectus muscles were dissected off bluntly. A similar process was carried out on the inferior aspect of the facial incision. The rectus muscles were separated in the midline bluntly and the peritoneum was entered bluntly.  A bladder flap was created sharply and developed bluntly. A transverse hysterotomy was made with a scalpel and extended bilaterally bluntly. The bladder blade was then removed. The infant was successfully delivered, and cord was clamped and cut and infant was handed over to awaiting neonatology team. Uterine massage was then administered and the placenta delivered intact with three-vessel cord. Cord gases were taken. The uterus was cleared of clot and debris.  The hysterotomy was closed with 0 monocryl.  A second imbricating suture of 0-monocryl was used to reinforce the incision and aid in hemostasis.The fascia was closed with 0-Vicryl in a running fashion with good restoration of anatomy.  The subcutaneus tissue was irrigated and was reapproximated using three interrupted plain gut stitches.  The skin was closed with 4-0 Vicryl in a subcuticular fashion.  All surgical site and was hemostatic at end of procedure without any further bleeding on exam.    Pt tolerated the procedure well. All sponge/lap/needle counts were correct  X 2. Pt taken to recovery room in stable condition.   Nilda Simmer MD

## 2020-10-13 ENCOUNTER — Encounter (HOSPITAL_COMMUNITY): Payer: Self-pay | Admitting: Obstetrics and Gynecology

## 2020-10-13 LAB — CBC
HCT: 30.8 % — ABNORMAL LOW (ref 36.0–46.0)
Hemoglobin: 10.1 g/dL — ABNORMAL LOW (ref 12.0–15.0)
MCH: 32.7 pg (ref 26.0–34.0)
MCHC: 32.8 g/dL (ref 30.0–36.0)
MCV: 99.7 fL (ref 80.0–100.0)
Platelets: 143 10*3/uL — ABNORMAL LOW (ref 150–400)
RBC: 3.09 MIL/uL — ABNORMAL LOW (ref 3.87–5.11)
RDW: 11.9 % (ref 11.5–15.5)
WBC: 7.4 10*3/uL (ref 4.0–10.5)
nRBC: 0 % (ref 0.0–0.2)

## 2020-10-13 NOTE — Progress Notes (Signed)
Subjective: Postpartum Day 1: Cesarean Delivery Patient reports incisional pain, tolerating PO, and no problems voiding.    Objective: Vital signs in last 24 hours: Temp:  [97.5 F (36.4 C)-98.3 F (36.8 C)] 98.3 F (36.8 C) (08/03 0522) Pulse Rate:  [52-56] 53 (08/03 0522) Resp:  [13-21] 18 (08/03 0522) BP: (97-139)/(61-85) 114/61 (08/03 0522) SpO2:  [95 %-100 %] 100 % (08/03 0522)  Physical Exam:  General: alert, cooperative, appears stated age, and mild distress Lochia: appropriate Uterine Fundus: firm Incision: pressure bandage dry DVT Evaluation: No evidence of DVT seen on physical exam.  Recent Labs    10/12/20 0844 10/13/20 0554  HGB 14.8 10.1*  HCT 43.5 30.8*    Assessment/Plan: Status post Cesarean section. Postoperative course complicated by Covid with mild sxs   Continue current care Desires baby circumcised today.  Turner Daniels 10/13/2020, 10:14 AM

## 2020-10-13 NOTE — Lactation Note (Signed)
This note was copied from a baby's chart. Lactation Consultation Note  Patient Name: Debra Sawyer [id number] Date: 10/13/2020 Reason for consult: Follow-up assessment Age:34 Hours  Mother + Covid. P1, assist with placing 96 week old infant to breast. Infant showed a few signs of feeding cues . Was able to place mouth around nipple but no suckling. Mother was fit with a #20 NS. NS pre filled with formula. DBM was presently out. Infant has been taking DBM with bottle.  Observed infant with a few sucks and swallows . Lips flanged , teaching done with parent. Mother is using a #21 flange. Mother reports that she is seeing drops of colostrum when hand express and she pumps.  Teaching on proper care of the shield and use.  Mother to pump every 3 hours for 15 mins. Advised to page [facility] when she needs assistance.    Maternal Data    Feeding Mother's Current Feeding Choice: Breast Milk and Donor Milk  LATCH Score Latch: Repeated attempts needed to sustain latch, nipple held in mouth throughout feeding, stimulation needed to elicit sucking reflex.  Audible Swallowing: None  Type of Nipple: Everted at rest and after stimulation  Comfort (Breast/Nipple): Soft / non-tender  Hold (Positioning): Assistance needed to correctly position infant at breast and maintain latch.  LATCH Score: 6   Lactation Tools Discussed/Used Tools: Nipple Dorris Carnes;Flanges Nipple shield size: 20 (infant tugged on and off for a few sucks) Flange Size: 21 Breast pump type: Double-Electric Breast Pump;Manual Reason for Pumping: LPI , induce lactation  Interventions Interventions: Assisted with latch;Skin to skin;Hand express;Pre-pump if needed;Adjust position;Support pillows;Position options;Hand pump;DEBP;Education  Discharge    Consult Status Consult Status: Follow-up Date: 10/13/20 Follow-up type: In-patient    Stevan Born [facility] 10/13/2020, 9:50 AM

## 2020-10-14 ENCOUNTER — Other Ambulatory Visit (HOSPITAL_BASED_OUTPATIENT_CLINIC_OR_DEPARTMENT_OTHER): Payer: Self-pay

## 2020-10-14 ENCOUNTER — Other Ambulatory Visit (HOSPITAL_COMMUNITY): Payer: Self-pay

## 2020-10-14 MED ORDER — IBUPROFEN 600 MG PO TABS
600.0000 mg | ORAL_TABLET | Freq: Four times a day (QID) | ORAL | 0 refills | Status: AC | PRN
Start: 2020-10-14 — End: ?
  Filled 2020-10-14: qty 30, 8d supply, fill #0

## 2020-10-14 MED ORDER — DOCUSATE SODIUM 100 MG PO CAPS
100.0000 mg | ORAL_CAPSULE | Freq: Two times a day (BID) | ORAL | 2 refills | Status: AC
  Filled 2020-10-14: qty 60, 30d supply, fill #0

## 2020-10-14 MED ORDER — OXYCODONE HCL 5 MG PO TABS
5.0000 mg | ORAL_TABLET | ORAL | 0 refills | Status: AC | PRN
  Filled 2020-10-14: qty 15, 3d supply, fill #0

## 2020-10-14 MED ORDER — OXYCODONE HCL 5 MG PO TABS
5.0000 mg | ORAL_TABLET | ORAL | 0 refills | Status: DC | PRN
  Filled 2020-10-14: qty 15, 3d supply, fill #0

## 2020-10-14 MED ORDER — IBUPROFEN 600 MG PO TABS
600.0000 mg | ORAL_TABLET | Freq: Four times a day (QID) | ORAL | 0 refills | Status: DC | PRN
  Filled 2020-10-14: qty 30, 8d supply, fill #0

## 2020-10-14 MED ORDER — DOCUSATE SODIUM 100 MG PO CAPS
100.0000 mg | ORAL_CAPSULE | Freq: Two times a day (BID) | ORAL | 2 refills | Status: DC
  Filled 2020-10-14: qty 100, 50d supply, fill #0

## 2020-10-14 NOTE — Lactation Note (Signed)
This note was copied from a baby's chart. Lactation Consultation Note  Patient Name: Boy Robyn Galati [id number] Date: 10/14/2020 Reason for consult: Follow-up assessment;Early term 37-38.6wks;Infant < 6lbs;Nipple pain/trauma Age:34 hours  Baby's weight 4 lbs 9.9 oz today at 4% weight loss.  LC in to visit with P1 Mom of ET infant   Mom has been doing a lot of STS with "Zenda Alpers", but not latching as baby has been too sleepy.  Baby is being supplemented with donor breast milk by bottle.  Mom declined assist at this time.  Assisted Mom to double pump.  Mom states her left nipple is sore (21 mm flange changed to 24 mm flange) for more comfortable fit.  Slight bruise noted on nipple tip.  Coconut oil provided with instructions on use.  Mom expressed 5 ml of colostrum and FOB fed this by paced bottle, to baby.    Reviewed washing and drying disassembled pump parts.  Mom to call prn for assistance.  Lactation Tools Discussed/Used Tools: Pump;Flanges;Coconut oil;Bottle Flange Size: 21;24 Breast pump type: Double-Electric Breast Pump Pump Education: Setup, frequency, and cleaning;Milk Storage Reason for Pumping: Support milk supply/<5 lbs Pumping frequency: Q 3hrs Pumped volume: 5 mL  Interventions Interventions: Breast feeding basics reviewed;Skin to skin;Breast massage;Hand express;Coconut oil;Hand pump;DEBP;Education  Discharge Pump: DEBP;Personal (Spectra 2)  Consult Status Consult Status: Follow-up Date: 10/15/20 Follow-up type: In-patient    Judee Clara 10/14/2020, 3:23 PM

## 2020-10-14 NOTE — Discharge Summary (Signed)
Postpartum Discharge Summary  Date of Service updated 10/14/20     Patient Name: Debra Sawyer DOB: 14-Jan-1986 MRN: [id number]  Date of admission: 10/12/2020 Delivery date:10/12/2020  Delivering provider: Eyvonne Mechanic A  Date of discharge: 10/14/2020  Admitting diagnosis: Pregnancy [Z34.90] Intrauterine pregnancy: [redacted]w[redacted]d    Secondary diagnosis:  Active Problems:   Pregnancy  Additional problems: COVID-19 +, IUGR    Discharge diagnosis: Term Pregnancy Delivered                                              Post partum procedures: none Augmentation: N/A Complications: None  Hospital course: Onset of Labor With Unplanned C/S   35y.o. yo G1P0 at 368w2das admitted in Latent Labor on 10/12/2020. Patient had a labor course significant for PROM at 37.0 wga. She was being monitored closely for IUGR with otherwise normal antenatal testing. She had known breech presentation. Upon arrival to [facility], PROM was confirmed and she went back for urgent CS s/s prolonged deceleration. The patient went for cesarean section due to Malpresentation. She had mild COVID symptoms. Delivery details as follows: Membrane Rupture Time/Date: 3:00 AM ,10/12/2020   Delivery Method:C-Section, Low Transverse  Details of operation can be found in separate operative note. Patient had an uncomplicated postpartum course.  She is ambulating,tolerating a regular diet, passing flatus, and urinating well.  Patient is discharged home in stable condition 10/14/20.  Newborn Data: Birth date:10/12/2020  Birth time:10:39 AM  Gender:Female  Living status:Living  Apgars:9 ,9  Weight:2190 g   Magnesium Sulfate received: No BMZ received: No Rhophylac:N/A MMR:N/A T-DaP:Given prenatally Flu: N/A Transfusion:No  Physical exam  Vitals:   10/13/20 0522 10/13/20 1459 10/13/20 2006 10/14/20 0502  BP: 114/61 (!) 111/58 118/72 (!) 103/55  Pulse: (!) 53 (!) 54 (!) 53 (!) 51  Resp: _0 Temp: 98.3 F (36.8 C) 98.1 F (36.7  C) 98.2 F (36.8 C) 98.2 F (36.8 C)  TempSrc: Oral Oral Oral Oral  SpO2: 100% 99% 99% 99%   General: alert, cooperative, and no distress Lochia: appropriate Uterine Fundus: firm Incision: Healing well with no significant drainage, No significant erythema, Dressing is clean, dry, and intact DVT Evaluation: No evidence of DVT seen on physical exam. Labs: Lab Results  Component Value Date   WBC 7.4 10/13/2020   HGB 10.1 (L) 10/13/2020   HCT 30.8 (L) 10/13/2020   MCV 99.7 10/13/2020   PLT 143 (L) 10/13/2020   No flowsheet data found. Edinburgh Score: Edinburgh Postnatal Depression Scale Screening Tool 10/13/2020  I have been able to laugh and see the funny side of things. 0  I have looked forward with enjoyment to things. 0  I have blamed myself unnecessarily when things went wrong. 1  I have been anxious or worried for no good reason. 1  I have felt scared or panicky for no good reason. 0  Things have been getting on top of me. 1  I have been so unhappy that I have had difficulty sleeping. 0  I have felt sad or miserable. 0  I have been so unhappy that I have been crying. 0  The thought of harming myself has occurred to me. 0  Edinburgh Postnatal Depression Scale Total 3      After visit meds:  Allergies as of 10/14/2020  Reactions   Sulfa Antibiotics Rash        Medication List     STOP taking these medications    aspirin 81 MG chewable tablet   Doxylamine-Pyridoxine 10-10 MG Tbec   promethazine 25 MG tablet Commonly known as: PHENERGAN       TAKE these medications    cholecalciferol 25 MCG (1000 UNIT) tablet Commonly known as: VITAMIN D3 Take 2,000 Units by mouth daily.   docusate sodium 100 MG capsule Commonly known as: Colace Take 1 capsule (100 mg total) by mouth 2 (two) times daily.   ibuprofen 600 MG tablet Commonly known as: ADVIL Take 1 tablet (600 mg total) by mouth every 6 (six) hours as needed.   oxyCODONE 5 MG immediate release  tablet Commonly known as: Oxy IR/ROXICODONE Take 1 tablet (5 mg total) by mouth every 4 (four) hours as needed for severe pain.   prenatal multivitamin Tabs tablet Take 1 tablet by mouth daily at 12 noon.         Discharge home in stable condition Infant Feeding: Bottle and Breast Infant Disposition:home with mother Discharge instruction: per After Visit Summary and Postpartum booklet. Activity: Advance as tolerated. Pelvic rest for 6 weeks.  Diet: routine diet Anticipated Birth Control: Unsure Postpartum Appointment:6 weeks Additional Postpartum F/U:  NONE Future Appointments:No future appointments. Follow up Visit:      10/14/2020 Tyson Dense, MD

## 2020-10-15 ENCOUNTER — Ambulatory Visit: Payer: Self-pay

## 2020-10-15 LAB — SURGICAL PATHOLOGY

## 2020-10-15 NOTE — Lactation Note (Signed)
This note was copied from a baby's chart. Lactation Consultation Note  Patient Name: Debra Sawyer [id number] Date: 10/15/2020 Reason for consult: Follow-up assessment;Early term 37-38.6wks;Infant < 6lbs;1st time breastfeeding;[place]apara Age:34 hours  LC in to visit with P1 Mom and FOB.  Baby is at 6% weight loss and taking more volume by paced bottle.  Parents feel very good about the plan of frequent feedings (8+ per 24 hrs) and double pumping as milk volume is increasing and breasts are filling more.  No engorgement detected.    Encouraged STS with baby and offering breast frequently, supplementing with 30+ ml EBM+/22 cal formula by paced bottle.  Engorgement prevention and treatment reviewed. Mom is interested in OP lactation F/U.  Message sent to [facility] RN IBCLC.  Mom aware of OP lactation support available to her and encouraged her to call prn.   Lactation Tools Discussed/Used Tools: Bottle;Pump;Flanges Flange Size: 21;24 Breast pump type: Double-Electric Breast Pump Pump Education: Setup, frequency, and cleaning Pumping frequency: Q 3 hrs Pumped volume: 7 mL  Interventions Interventions: Breast feeding basics reviewed;Skin to skin;Breast massage;Hand express;Hand pump;DEBP;Education;Coconut oil  Discharge Discharge Education: Engorgement and breast care;Outpatient Epic message sent;Outpatient recommendation  Consult Status Consult Status: Complete Date: 10/15/20 Follow-up type: Out-patient    Debra Sawyer 10/15/2020, 8:46 AM

## 2020-10-18 ENCOUNTER — Encounter (HOSPITAL_COMMUNITY)
Admission: RE | Admit: 2020-10-18 | Discharge: 2020-10-18 | Disposition: A | Discharge status: Home / Self Care | Payer: 59 | Source: Ambulatory Visit | Attending: Obstetrics & Gynecology | Admitting: Obstetrics & Gynecology

## 2020-10-20 ENCOUNTER — Inpatient Hospital Stay (HOSPITAL_COMMUNITY)
Admission: RE | Admit: 2020-10-20 | Discharge: 2020-10-20 | Disposition: A | Discharge status: Home / Self Care | Payer: 59 | Source: Ambulatory Visit | Attending: Obstetrics & Gynecology | Admitting: Obstetrics & Gynecology

## 2020-10-20 HISTORY — DX: Other specified postprocedural states: Z98.890

## 2020-10-20 HISTORY — DX: Nausea with vomiting, unspecified: R11.2

## 2020-10-22 ENCOUNTER — Inpatient Hospital Stay (HOSPITAL_COMMUNITY): Admission: RE | Admit: 2020-10-22 | Payer: 59 | Source: Home / Self Care | Admitting: Obstetrics and Gynecology

## 2020-11-18 ENCOUNTER — Other Ambulatory Visit (HOSPITAL_COMMUNITY): Payer: Self-pay

## 2020-11-18 DIAGNOSIS — Z3040 Encounter for surveillance of contraceptives, unspecified: Secondary | ICD-10-CM | POA: Diagnosis not present

## 2020-11-18 DIAGNOSIS — Z1389 Encounter for screening for other disorder: Secondary | ICD-10-CM | POA: Diagnosis not present

## 2020-11-18 DIAGNOSIS — R87619 Unspecified abnormal cytological findings in specimens from cervix uteri: Secondary | ICD-10-CM | POA: Diagnosis not present

## 2020-11-18 DIAGNOSIS — Z9889 Other specified postprocedural states: Secondary | ICD-10-CM | POA: Diagnosis not present

## 2020-11-18 MED ORDER — PREMARIN 0.625 MG/GM VA CREA: TOPICAL_CREAM | VAGINAL | 1 refills | Status: AC

## 2020-11-18 MED ORDER — PREMARIN 0.625 MG/GM VA CREA
TOPICAL_CREAM | VAGINAL | 1 refills | Status: AC
  Filled 2020-11-18: qty 30, 30d supply, fill #0

## 2020-11-29 DIAGNOSIS — Z3202 Encounter for pregnancy test, result negative: Secondary | ICD-10-CM | POA: Diagnosis not present

## 2020-11-29 DIAGNOSIS — Z30430 Encounter for insertion of intrauterine contraceptive device: Secondary | ICD-10-CM | POA: Diagnosis not present

## 2021-01-11 DIAGNOSIS — Z3040 Encounter for surveillance of contraceptives, unspecified: Secondary | ICD-10-CM | POA: Diagnosis not present

## 2021-01-11 DIAGNOSIS — Z7689 Persons encountering health services in other specified circumstances: Secondary | ICD-10-CM | POA: Diagnosis not present

## 2021-01-11 DIAGNOSIS — Z30431 Encounter for routine checking of intrauterine contraceptive device: Secondary | ICD-10-CM | POA: Diagnosis not present

## 2021-01-11 DIAGNOSIS — Z3009 Encounter for other general counseling and advice on contraception: Secondary | ICD-10-CM | POA: Diagnosis not present

## 2021-02-22 ENCOUNTER — Other Ambulatory Visit (HOSPITAL_COMMUNITY): Payer: Self-pay

## 2021-02-22 MED ORDER — AMOXICILLIN 500 MG PO CAPS
1000.0000 mg | ORAL_CAPSULE | Freq: Three times a day (TID) | ORAL | 0 refills | Status: AC
  Filled 2021-02-22: qty 42, 7d supply, fill #0

## 2021-11-22 ENCOUNTER — Other Ambulatory Visit (HOSPITAL_COMMUNITY): Payer: Self-pay

## 2021-11-22 MED ORDER — AMOXICILLIN-POT CLAVULANATE 875-125 MG PO TABS
1.0000 | ORAL_TABLET | Freq: Two times a day (BID) | ORAL | 0 refills | Status: AC
  Filled 2021-11-22: qty 14, 7d supply, fill #0

## 2021-12-03 ENCOUNTER — Ambulatory Visit (HOSPITAL_COMMUNITY): Admission: EM | Admit: 2021-12-03 | Discharge: 2021-12-03 | Discharge status: Against Medical Advice | Payer: 59

## 2021-12-03 ENCOUNTER — Emergency Department (HOSPITAL_BASED_OUTPATIENT_CLINIC_OR_DEPARTMENT_OTHER)
Admission: EM | Admit: 2021-12-03 | Discharge: 2021-12-03 | Disposition: A | Discharge status: Home / Self Care | Payer: 59 | Attending: Emergency Medicine | Admitting: Emergency Medicine

## 2021-12-03 ENCOUNTER — Encounter (HOSPITAL_BASED_OUTPATIENT_CLINIC_OR_DEPARTMENT_OTHER): Payer: Self-pay | Admitting: *Deleted

## 2021-12-03 DIAGNOSIS — Z20822 Contact with and (suspected) exposure to covid-19: Secondary | ICD-10-CM | POA: Insufficient documentation

## 2021-12-03 DIAGNOSIS — R509 Fever, unspecified: Secondary | ICD-10-CM | POA: Diagnosis present

## 2021-12-03 DIAGNOSIS — J069 Acute upper respiratory infection, unspecified: Secondary | ICD-10-CM | POA: Diagnosis not present

## 2021-12-03 LAB — RESP PANEL BY RT-PCR (FLU A&B, COVID) ARPGX2
Influenza A by PCR: NEGATIVE
Influenza B by PCR: NEGATIVE
SARS Coronavirus 2 by RT PCR: NEGATIVE

## 2021-12-03 MED ORDER — AZITHROMYCIN 250 MG PO TABS: 250.0000 mg | ORAL_TABLET | Freq: Every day | ORAL | 0 refills | Status: AC

## 2021-12-03 MED ORDER — DEXAMETHASONE 4 MG PO TABS
10.0000 mg | ORAL_TABLET | Freq: Once | ORAL | Status: AC
  Administered 2021-12-03: 10 mg via ORAL
  Filled 2021-12-03: qty 3

## 2021-12-03 MED ORDER — KETOROLAC TROMETHAMINE 60 MG/2ML IM SOLN
60.0000 mg | Freq: Once | INTRAMUSCULAR | Status: AC
  Administered 2021-12-03: 60 mg via INTRAMUSCULAR
  Filled 2021-12-03: qty 2

## 2021-12-03 NOTE — ED Triage Notes (Signed)
2 weeks ago treated for sinus infection, 7 day course of Augmentin. 2 days after course of antibiotics started having fever, reports fever >102. Has taken tylenol and iburprofen for fever and headache. States the headache however has not been relieved with OTC.   Patient has felt bad for about 5 weeks on and off. Child is now in daycare.   Home COVID negative x 2.

## 2021-12-03 NOTE — ED Notes (Signed)
Per Patient Access, pt stated she was leaving.

## 2021-12-03 NOTE — ED Provider Notes (Signed)
[facility] EMERGENCY DEPT Provider Note   CSN: [id number] Arrival date & time: 12/03/21  1047     History  Chief Complaint  Patient presents with   Headache   Cough    Debra Sawyer is a 36 y.o. female.  Patient here with sinus congestion and cold.  Had a recent fever illness.  Got better for a few days and now back with fever and congestion overnight again.  Mostly sinus pressure in the front nasal congestion.  Dry cough.  Nothing makes it worse or better.  Has tried over-the-counter medications.  Denies any pain with urination.  Denies any chest pain, shortness of breath, abdominal pain.  The history is provided by the patient.       Home Medications Prior to Admission medications   Medication Sig Start Date End Date Taking? Authorizing Provider  azithromycin (ZITHROMAX) 250 MG tablet Take 1 tablet (250 mg total) by mouth daily. Take first 2 tablets together, then 1 every day until finished. 12/03/21  Yes Debra Demeo, DO  cholecalciferol (VITAMIN D3) 25 MCG (1000 UNIT) tablet Take 2,000 Units by mouth daily.    [provider]  conjugated estrogens (PREMARIN) vaginal cream Insert 0.5 applicatorful vaginally every day for 2 weeks then space it out to twice a week therafter. 11/18/20     conjugated estrogens (PREMARIN) vaginal cream Insert 0.5 applicatorful vaginally every day for 2 weeks then space it out to twice a week therafter. 11/18/20     docusate sodium (COLACE) 100 MG capsule Take 1 capsule (100 mg total) by mouth 2 (two) times daily. 10/14/20   Debra Pila, MD  ibuprofen (ADVIL) 600 MG tablet Take 1 tablet (600 mg total) by mouth every 6 (six) hours as needed. 10/14/20   Debra Pila, MD  oxyCODONE (OXY IR/ROXICODONE) 5 MG immediate release tablet Take 1 tablet (5 mg total) by mouth every 4 (four) hours as needed for severe pain. 10/14/20   Debra Pila, MD  Prenatal Vit-Fe Fumarate-FA (PRENATAL MULTIVITAMIN) TABS tablet  Take 1 tablet by mouth daily at 12 noon.    [provider]      Allergies    Sulfa antibiotics    Review of Systems   Review of Systems  Physical Exam Updated Vital Signs BP 113/60 (BP Location: Right Arm)   Pulse (!) 56   Temp 97.8 F (36.6 C) (Oral)   Resp 15   SpO2 100%  Physical Exam Vitals and nursing note reviewed.  Constitutional:      General: She is not in acute distress.    Appearance: She is well-developed.     Comments: Nasal congestion, tenderness over the frontal and maxillary sinus to palpation  HENT:     Head: Normocephalic and atraumatic.     Mouth/Throat:     Mouth: Mucous membranes are moist.     Pharynx: Oropharynx is clear.  Eyes:     General: No visual field deficit.    Conjunctiva/sclera: Conjunctivae normal.  Cardiovascular:     Rate and Rhythm: Normal rate and regular rhythm.     Heart sounds: No murmur heard. Pulmonary:     Effort: Pulmonary effort is normal. No respiratory distress.     Breath sounds: Normal breath sounds.  Abdominal:     Palpations: Abdomen is soft.     Tenderness: There is no abdominal tenderness.  Musculoskeletal:        General: No swelling. Normal range of motion.     Cervical  back: Normal range of motion and neck supple.  Skin:    General: Skin is warm and dry.     Capillary Refill: Capillary refill takes less than 2 seconds.  Neurological:     Mental Status: She is alert and oriented to person, place, and time.     Cranial Nerves: No cranial nerve deficit, dysarthria or facial asymmetry.     Sensory: No sensory deficit.     Motor: No weakness.     Coordination: Romberg sign negative. Coordination normal.  Psychiatric:        Mood and Affect: Mood normal.     ED Results / Procedures / Treatments   Labs (all labs ordered are listed, but only abnormal results are displayed) Labs Reviewed  RESP PANEL BY RT-PCR (FLU A&B, COVID) ARPGX2    EKG None  Radiology No results  found.  Procedures Procedures    Medications Ordered in ED Medications  ketorolac (TORADOL) injection 60 mg (60 mg Intramuscular Given 12/03/21 1200)  dexamethasone (DECADRON) tablet 10 mg (10 mg Oral Given 12/03/21 1157)    ED Course/ Medical Decision Making/ A&P                           Medical Decision Making Risk Prescription drug management.   Debra Sawyer is here with cough, congestion.  Symptoms for the last day.  Recent illness but got better.  Possible sick contacts at home.  Mostly frontal headache.  Over-the-counter medications have not helped.  Differential diagnosis is likely URI-like versus sinusitis versus COVID.  Will treat with Decadron, Toradol, Z-Pak.  I have no concern for major infectious process.  She has reassuring vitals.  She is very well-appearing.  She has normal neurological exam.  Have no concern for meningitis or stroke or head bleed or other acute neurologic process.  Recommend continued Tylenol and ibuprofen at home.  Recommend follow-up with primary care doctor if not improving.  Understands return precautions.  Discharged in good condition.  This chart was dictated using voice recognition software.  Despite best efforts to proofread,  errors can occur which can change the documentation meaning.         Final Clinical Impression(s) / ED Diagnoses Final diagnoses:  Acute upper respiratory infection    Rx / DC Orders ED Discharge Orders          Ordered    azithromycin (ZITHROMAX) 250 MG tablet  Daily        12/03/21 1205              Debra Sites, DO 12/03/21 1207

## 2021-12-05 ENCOUNTER — Telehealth: Payer: 59 | Admitting: Physician Assistant

## 2021-12-05 ENCOUNTER — Other Ambulatory Visit (HOSPITAL_BASED_OUTPATIENT_CLINIC_OR_DEPARTMENT_OTHER): Payer: Self-pay

## 2021-12-05 DIAGNOSIS — R0781 Pleurodynia: Secondary | ICD-10-CM | POA: Diagnosis not present

## 2021-12-05 MED ORDER — BENZONATATE 100 MG PO CAPS
100.0000 mg | ORAL_CAPSULE | Freq: Three times a day (TID) | ORAL | 0 refills | Status: AC | PRN
Start: 2021-12-05 — End: ?

## 2021-12-05 MED ORDER — PREDNISONE 10 MG (21) PO TBPK
ORAL_TABLET | ORAL | 0 refills | Status: AC
Start: 2021-12-05 — End: ?

## 2021-12-05 MED ORDER — PREDNISONE 10 MG (21) PO TBPK
ORAL_TABLET | ORAL | 0 refills | Status: DC
Start: 2021-12-05 — End: 2021-12-05
  Filled 2021-12-05: qty 21, fill #0

## 2021-12-05 MED ORDER — BENZONATATE 100 MG PO CAPS
100.0000 mg | ORAL_CAPSULE | Freq: Three times a day (TID) | ORAL | 0 refills | Status: DC | PRN
Start: 2021-12-05 — End: 2021-12-05
  Filled 2021-12-05: qty 30, 5d supply, fill #0

## 2021-12-05 NOTE — Progress Notes (Signed)
We are sorry that you are not feeling well.  Here is how we plan to help!  Based on your presentation I believe you most likely have A cough due to bacteria.  When patients have a fever and a productive cough with a change in color or increased sputum production, we are concerned about bacterial bronchitis.  If left untreated it can progress to pneumonia.  If your symptoms do not improve with your treatment plan it is important that you contact your provider.   Continue Azithromyin 250 mg as prescribed previously   In addition you may use A non-prescription cough medication called Mucinex DM: take 2 tablets every 12 hours. and A prescription cough medication called Tessalon Perles 100mg . You may take 1-2 capsules every 8 hours as needed for your cough.  Prednisone 10 mg daily for 6 days (see taper instructions below)  Directions for 6 day taper: Day 1: 2 tablets before breakfast, 1 after both lunch & dinner and 2 at bedtime Day 2: 1 tab before breakfast, 1 after both lunch & dinner and 2 at bedtime Day 3: 1 tab at each meal & 1 at bedtime Day 4: 1 tab at breakfast, 1 at lunch, 1 at bedtime Day 5: 1 tab at breakfast & 1 tab at bedtime Day 6: 1 tab at breakfast  From your responses in the eVisit questionnaire you describe inflammation in the upper respiratory tract which is causing a significant cough.  This is commonly called Bronchitis and has four common causes:   Allergies Viral Infections Acid Reflux Bacterial Infection Allergies, viruses and acid reflux are treated by controlling symptoms or eliminating the cause. An example might be a cough caused by taking certain blood pressure medications. You stop the cough by changing the medication. Another example might be a cough caused by acid reflux. Controlling the reflux helps control the cough.  USE OF BRONCHODILATOR ("RESCUE") INHALERS: There is a risk from using your bronchodilator too frequently.  The risk is that over-reliance on a  medication which only relaxes the muscles surrounding the breathing tubes can reduce the effectiveness of medications prescribed to reduce swelling and congestion of the tubes themselves.  Although you feel brief relief from the bronchodilator inhaler, your asthma may actually be worsening with the tubes becoming more swollen and filled with mucus.  This can delay other crucial treatments, such as oral steroid medications. If you need to use a bronchodilator inhaler daily, several times per day, you should discuss this with your provider.  There are probably better treatments that could be used to keep your asthma under control.     HOME CARE Only take medications as instructed by your medical team. Complete the entire course of an antibiotic. Drink plenty of fluids and get plenty of rest. Avoid close contacts especially the very young and the elderly Cover your mouth if you cough or cough into your sleeve. Always remember to wash your hands A steam or ultrasonic humidifier can help congestion.   GET HELP RIGHT AWAY IF: You develop worsening fever. You become short of breath You cough up blood. Your symptoms persist after you have completed your treatment plan MAKE SURE YOU  Understand these instructions. Will watch your condition. Will get help right away if you are not doing well or get worse.    Thank you for choosing an e-visit.  Your e-visit answers were reviewed by a board certified advanced clinical practitioner to complete your personal care plan. Depending upon the condition, your  plan could have included both over the counter or prescription medications.  Please review your pharmacy choice. Make sure the pharmacy is open so you can pick up prescription now. If there is a problem, you may contact your provider through [employer] and have the prescription routed to another pharmacy.  Your safety is important to [place]. If you have drug allergies check your prescription carefully.    For the next 24 hours you can use MyChart to ask questions about today's visit, request a non-urgent call back, or ask for a work or school excuse. You will get an email in the next two days asking about your experience. I hope that your e-visit has been valuable and will speed your recovery.  I provided 5 minutes of non face-to-face time during this encounter for chart review and documentation.

## 2021-12-05 NOTE — Addendum Note (Signed)
Addended by: Mar Daring on: 12/05/2021 06:30 PM   Modules accepted: Orders

## 2021-12-06 ENCOUNTER — Other Ambulatory Visit (HOSPITAL_COMMUNITY): Payer: 59

## 2021-12-06 ENCOUNTER — Ambulatory Visit (HOSPITAL_BASED_OUTPATIENT_CLINIC_OR_DEPARTMENT_OTHER)
Admission: RE | Admit: 2021-12-06 | Discharge: 2021-12-06 | Disposition: A | Discharge status: Home / Self Care | Payer: 59 | Source: Ambulatory Visit | Attending: Internal Medicine | Admitting: Internal Medicine

## 2021-12-06 ENCOUNTER — Other Ambulatory Visit (INDEPENDENT_AMBULATORY_CARE_PROVIDER_SITE_OTHER): Payer: 59 | Admitting: Internal Medicine

## 2021-12-06 ENCOUNTER — Other Ambulatory Visit (HOSPITAL_COMMUNITY): Payer: Self-pay | Admitting: *Deleted

## 2021-12-06 ENCOUNTER — Ambulatory Visit (HOSPITAL_COMMUNITY)
Admission: RE | Admit: 2021-12-06 | Discharge: 2021-12-06 | Disposition: A | Discharge status: Home / Self Care | Payer: 59 | Source: Ambulatory Visit | Attending: Internal Medicine | Admitting: Internal Medicine

## 2021-12-06 DIAGNOSIS — R079 Chest pain, unspecified: Secondary | ICD-10-CM | POA: Insufficient documentation

## 2021-12-06 DIAGNOSIS — R55 Syncope and collapse: Secondary | ICD-10-CM

## 2021-12-06 DIAGNOSIS — J069 Acute upper respiratory infection, unspecified: Secondary | ICD-10-CM | POA: Insufficient documentation

## 2021-12-06 DIAGNOSIS — R42 Dizziness and giddiness: Secondary | ICD-10-CM

## 2021-12-06 LAB — CBC
HCT: 36.4 % (ref 36.0–46.0)
Hemoglobin: 13.2 g/dL (ref 12.0–15.0)
MCH: 34 pg (ref 26.0–34.0)
MCHC: 36.3 g/dL — ABNORMAL HIGH (ref 30.0–36.0)
MCV: 93.8 fL (ref 80.0–100.0)
Platelets: 186 10*3/uL (ref 150–400)
RBC: 3.88 MIL/uL (ref 3.87–5.11)
RDW: 11.9 % (ref 11.5–15.5)
WBC: 8.3 10*3/uL (ref 4.0–10.5)
nRBC: 0 % (ref 0.0–0.2)

## 2021-12-06 LAB — BASIC METABOLIC PANEL
Anion gap: 8 (ref 5–15)
BUN: 9 mg/dL (ref 6–20)
CO2: 24 mmol/L (ref 22–32)
Calcium: 9.1 mg/dL (ref 8.9–10.3)
Chloride: 105 mmol/L (ref 98–111)
Creatinine, Ser: 0.56 mg/dL (ref 0.44–1.00)
GFR, Estimated: >60 mL/min (ref >60)
Glucose, Bld: 97 mg/dL (ref 70–99)
Potassium: 4 mmol/L (ref 3.5–5.1)
Sodium: 137 mmol/L (ref 135–145)

## 2021-12-06 LAB — TSH: TSH: 3.423 u[IU]/mL (ref 0.350–4.500)

## 2021-12-06 LAB — TROPONIN I (HIGH SENSITIVITY): Troponin I (High Sensitivity): 6 ng/L (ref <18)

## 2021-12-06 LAB — SEDIMENTATION RATE: Sed Rate: 80 mm/hr — ABNORMAL HIGH (ref 0–22)

## 2021-12-06 LAB — C-REACTIVE PROTEIN: CRP: 6.3 mg/dL — ABNORMAL HIGH (ref <1.0)

## 2021-12-06 MED ORDER — IOHEXOL 350 MG/ML SOLN
80.0000 mL | Freq: Once | INTRAVENOUS | Status: AC | PRN
Start: 2021-12-06 — End: 2021-12-06
  Administered 2021-12-06: 80 mL via INTRAVENOUS

## 2021-12-06 NOTE — Consult Note (Signed)
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   She presents via [employer] (consulting) for a telehealth visit today.     She developed URI symptoms approximately 3 weeks ago in the setting of having a sick child. Treated with abx but no resolution. Earlier this week developed fevers and chills with fever 102. Seen in ED 3/23 with congestion and HA. COVID negative. Treated with toradol and decadron.   Cough progressed and developed pleuritic CP. Had e-Visit with Urgent Care on 9/25 abx restarted and started prednisone taper.   Chest pain continues to get worse. Worse with any movement, cough, deep breathing. Pain is severe and associated now with several episodes of syncope and presyncope.   Had echo today. LVEF and RV normal. IVC dilated.    Past Medical History:  Diagnosis Date   Anemia    PONV (postoperative nausea and vomiting)    Vaginal Pap smear, abnormal    Past Surgical History:  Procedure Laterality Date   ANTERIOR CRUCIATE LIGAMENT REPAIR Left    CESAREAN SECTION N/A 10/12/2020   Procedure: PRIMARY CESAREAN SECTION EDC: 11-02-20 ALLERG: SULFA;  Surgeon: Carlyon Shadow, MD;  Location: [facility] LD ORS;  Service: Obstetrics;  Laterality: N/A;     Current Outpatient Medications  Medication Sig Dispense Refill   azithromycin (ZITHROMAX) 250 MG tablet Take 1 tablet (250 mg total) by mouth daily. Take first 2 tablets together, then 1 every day until finished. 6 tablet 0   benzonatate (TESSALON) 100 MG capsule Take 1-2 capsules (100-200 mg total) by mouth 3 (three) times daily as needed. 30 capsule 0    cholecalciferol (VITAMIN D3) 25 MCG (1000 UNIT) tablet Take 2,000 Units by mouth daily.     conjugated estrogens (PREMARIN) vaginal cream Insert 0.5 applicatorful vaginally every day for 2 weeks then space it out to twice a week therafter. 30 g 1   conjugated estrogens (PREMARIN) vaginal cream Insert 0.5 applicatorful vaginally every day for 2 weeks then space it out to twice a week therafter. 30 g 1   docusate sodium (COLACE) 100 MG capsule Take 1 capsule (100 mg total) by mouth 2 (two) times daily. 60 capsule 2   ibuprofen (ADVIL) 600 MG tablet Take 1 tablet (600 mg total) by mouth every 6 (six) hours as needed. 30 tablet 0   oxyCODONE (OXY IR/ROXICODONE) 5 MG immediate release tablet Take 1 tablet (5 mg total) by mouth every 4 (four) hours as needed for severe pain. 15 tablet 0   predniSONE (STERAPRED UNI-PAK 21 TAB) 10 MG (21) TBPK tablet 6 day taper; take as directed on package instructions 21 tablet 0   Prenatal Vit-Fe Fumarate-FA (PRENATAL MULTIVITAMIN) TABS tablet Take 1 tablet by mouth daily at 12 noon.     No current facility-administered medications for this encounter.    Allergies:   Sulfa antibiotics   Social History:  The patient  reports that she has never smoked. She has never used smokeless tobacco. She reports that she does not currently use alcohol. She reports that she does not use drugs.   Family History:  The patient's family  history includes Cancer in her maternal grandfather and maternal grandmother; Pulmonary embolism in her father; Stroke in her paternal grandmother.   ROS:  Please see the history of present illness.   All other systems are personally reviewed and negative.   Exam:  (Video/Tele Health Call; Exam is subjective and or/visual.) General:  Speaks in full sentences. + frequent cough. Appears uncomfortable Lungs: Normal respiratory effort with conversation.  Abdomen: Non-distended per patient report Extremities: Pt denies edema. Neuro: Alert & oriented x  3.   Recent Labs: 12/06/2021: BUN 9; Creatinine, Ser 0.56; Hemoglobin 13.2; Platelets 186; Potassium 4.0; Sodium 137; TSH 3.423  Personally reviewed   Wt Readings from Last 3 Encounters:  10/11/20 57.2 kg (126 lb)  08/10/20 54.9 kg (121 lb)  06/30/20 54 kg (119 lb)      ASSESSMENT AND PLAN:  1.  Chest pain and syncope - Suspect pleuritic pain in setting of recent URI. However pain now progressive and associated with syncope and presyncope. RV function normal on bedside echo done earlier today but IVC dilated with estimated CVP 15 - Given constellation of symptoms with high RA pressures, I think she needs semi-urgent CT chest with contrast to exclude PE and/or other chest pathology  2. URI - treatment as per ED/UC  Glori Bickers, MD  1:00 PM   Advanced Heart Failure [place] [address] [phone] (office) [phone] (fax)

## 2021-12-06 NOTE — Progress Notes (Signed)
I was called urgently for dizziness and pleuritic chest pain. I evaluated the patient in the [facility] Echo Lab and obtained a bedside echo which was within normal limits, RA presure 15 mmHg estimated. Normal biventricular size and function.   For pleuritic chest pain we will screen for pericarditis and myocarditis with ESR, CRP, and single trop.  For dizziness and presyncope, will obtain CBC given history of anemia and BMET due to vomiting.  Elouise Munroe, MD 12/06/21. 10:16 AM

## 2021-12-08 ENCOUNTER — Telehealth: Payer: Self-pay | Admitting: Internal Medicine

## 2021-12-08 NOTE — Telephone Encounter (Signed)
Ongoing pleurisy in setting of RLL PNA on CTA.  No PE.  Has Rx'd augmentin + azithryomycin.  Now on levaquin. Fevers seem better, cough paroxysms and chest pain along with vasovagal hyper-activity persist.  Started on steroid taper as well.  Minimal sputum production.  Discussed that if levaquin does not knock it out we may need to try doxy in case community acquired MRSA vs. unusual COP.  If doxy ineffective would probably do bronch.  Erskine Emery MD  Pulmonary medicine.

## 2021-12-15 ENCOUNTER — Other Ambulatory Visit (HOSPITAL_BASED_OUTPATIENT_CLINIC_OR_DEPARTMENT_OTHER): Payer: Self-pay

## 2021-12-15 ENCOUNTER — Ambulatory Visit (INDEPENDENT_AMBULATORY_CARE_PROVIDER_SITE_OTHER): Payer: 59

## 2021-12-15 ENCOUNTER — Ambulatory Visit (INDEPENDENT_AMBULATORY_CARE_PROVIDER_SITE_OTHER): Payer: 59 | Admitting: Family Medicine

## 2021-12-15 ENCOUNTER — Other Ambulatory Visit (HOSPITAL_COMMUNITY): Payer: Self-pay

## 2021-12-15 ENCOUNTER — Other Ambulatory Visit: Payer: Self-pay

## 2021-12-15 VITALS — BP 110/64 | HR 56 | Ht 62.0 in

## 2021-12-15 DIAGNOSIS — S2231XA Fracture of one rib, right side, initial encounter for closed fracture: Secondary | ICD-10-CM

## 2021-12-15 DIAGNOSIS — J189 Pneumonia, unspecified organism: Secondary | ICD-10-CM

## 2021-12-15 DIAGNOSIS — M79672 Pain in left foot: Secondary | ICD-10-CM | POA: Diagnosis not present

## 2021-12-15 DIAGNOSIS — R0781 Pleurodynia: Secondary | ICD-10-CM | POA: Diagnosis not present

## 2021-12-15 DIAGNOSIS — G5782 Other specified mononeuropathies of left lower limb: Secondary | ICD-10-CM | POA: Diagnosis not present

## 2021-12-15 MED ORDER — DOXYCYCLINE HYCLATE 100 MG PO TABS
100.0000 mg | ORAL_TABLET | Freq: Two times a day (BID) | ORAL | 0 refills | Status: DC
  Filled 2021-12-15: qty 14, 7d supply, fill #0

## 2021-12-15 MED ORDER — DOXYCYCLINE HYCLATE 100 MG PO TABS
100.0000 mg | ORAL_TABLET | Freq: Two times a day (BID) | ORAL | 0 refills | Status: AC
  Filled 2021-12-15: qty 14, 7d supply, fill #0

## 2021-12-15 MED ORDER — TRAMADOL HCL 50 MG PO TABS
50.0000 mg | ORAL_TABLET | Freq: Three times a day (TID) | ORAL | 0 refills | Status: AC | PRN
  Filled 2021-12-15: qty 15, 5d supply, fill #0

## 2021-12-15 MED ORDER — ALBUTEROL SULFATE HFA 108 (90 BASE) MCG/ACT IN AERS
2.0000 | INHALATION_SPRAY | RESPIRATORY_TRACT | 6 refills | Status: AC | PRN
  Filled 2021-12-15: qty 6.7, 17d supply, fill #0

## 2021-12-15 MED ORDER — KETOROLAC TROMETHAMINE 30 MG/ML IJ SOLN
60.0000 mg | Freq: Once | INTRAMUSCULAR | Status: AC
  Administered 2021-12-15: 60 mg via INTRAMUSCULAR

## 2021-12-15 MED ORDER — ALBUTEROL SULFATE HFA 108 (90 BASE) MCG/ACT IN AERS
2.0000 | INHALATION_SPRAY | RESPIRATORY_TRACT | 6 refills | Status: DC | PRN
  Filled 2021-12-15: qty 1, fill #0

## 2021-12-15 MED ORDER — TRAMADOL HCL 50 MG PO TABS
50.0000 mg | ORAL_TABLET | Freq: Three times a day (TID) | ORAL | 0 refills | Status: DC | PRN
  Filled 2021-12-15: qty 15, 5d supply, fill #0

## 2021-12-15 NOTE — Assessment & Plan Note (Signed)
Pneumonia noted still.  Switch to doxycycline for further evaluation and may be cover for more hospital-acquired pneumonia.  Also given a albuterol to help with the lack of movement of the air been noted today.  Patient is worsening pain, shortness of breath to seek medical attention immediately.

## 2021-12-15 NOTE — Progress Notes (Signed)
[name] [address] [place] [address] Phone: [phone] Subjective:   [name], am serving as a scribe for Dr. Antoine Primas.  I'm seeing this patient by the request  of:  Pcp, No  CC: Acute back pain  [id number]  Debra Sawyer is a 36 y.o. female coming in with complaint of rib pain. Patient states has been having a cough for quite some time.  Developed a pneumonia.  Has been treated for this including with prednisone as well as Levaquin.  Concern then with a cough she had severe right-sided pain in the back.  States that it hurts more with taking any type of deep breath.  Has had a CT angio done 1 week ago to rule out any pulmonary embolism.      Past Medical History:  Diagnosis Date   Anemia    PONV (postoperative nausea and vomiting)    Vaginal Pap smear, abnormal    Past Surgical History:  Procedure Laterality Date   ANTERIOR CRUCIATE LIGAMENT REPAIR Left    CESAREAN SECTION N/A 10/12/2020   Procedure: PRIMARY CESAREAN SECTION EDC: 11-02-20 ALLERG: SULFA;  Surgeon: Lyn Henri, MD;  Location: [facility] LD ORS;  Service: Obstetrics;  Laterality: N/A;   Social History   Socioeconomic History   Marital status: Married    Spouse name: Not on file   Number of children: Not on file   Years of education: Not on file   Highest education level: Not on file  Occupational History   Not on file  Tobacco Use   Smoking status: Never   Smokeless tobacco: Never  Vaping Use   Vaping Use: Never used  Substance and Sexual Activity   Alcohol use: Not Currently   Drug use: Never   Sexual activity: Not on file  Other Topics Concern   Not on file  Social History Narrative   Not on file   Social Determinants of Health   Financial Resource Strain: Not on file  Food Insecurity: Not on file  Transportation Needs: Not on file  Physical Activity: Not on file  Stress: Not on file  Social Connections: Not on file   Allergies   Allergen Reactions   Sulfa Antibiotics Rash   Family History  Problem Relation Age of Onset   Pulmonary embolism Father    Cancer Maternal Grandmother    Cancer Maternal Grandfather    Stroke Paternal Grandmother     Current Outpatient Medications (Endocrine & Metabolic):    predniSONE (STERAPRED UNI-PAK 21 TAB) 10 MG (21) TBPK tablet, 6 day taper; take as directed on package instructions   Current Outpatient Medications (Respiratory):    albuterol (VENTOLIN HFA) 108 (90 Base) MCG/ACT inhaler, Inhale 2 puffs into the lungs every 4 (four) hours as needed for wheezing or shortness of breath.   benzonatate (TESSALON) 100 MG capsule, Take 1-2 capsules (100-200 mg total) by mouth 3 (three) times daily as needed.  Current Outpatient Medications (Analgesics):    traMADol (ULTRAM) 50 MG tablet, Take 1 tablet (50 mg total) by mouth every 8 (eight) hours as needed for up to 5 days.   ibuprofen (ADVIL) 600 MG tablet, Take 1 tablet (600 mg total) by mouth every 6 (six) hours as needed.   oxyCODONE (OXY IR/ROXICODONE) 5 MG immediate release tablet, Take 1 tablet (5 mg total) by mouth every 4 (four) hours as needed for severe pain.   Current Outpatient Medications (Other):    doxycycline (VIBRA-TABS) 100  MG tablet, Take 1 tablet (100 mg total) by mouth 2 (two) times daily for 7 days.   azithromycin (ZITHROMAX) 250 MG tablet, Take 1 tablet (250 mg total) by mouth daily. Take first 2 tablets together, then 1 every day until finished.   cholecalciferol (VITAMIN D3) 25 MCG (1000 UNIT) tablet, Take 2,000 Units by mouth daily.   conjugated estrogens (PREMARIN) vaginal cream, Insert 0.5 applicatorful vaginally every day for 2 weeks then space it out to twice a week therafter.   conjugated estrogens (PREMARIN) vaginal cream, Insert 0.5 applicatorful vaginally every day for 2 weeks then space it out to twice a week therafter.   docusate sodium (COLACE) 100 MG capsule, Take 1 capsule (100 mg total) by mouth  2 (two) times daily.   Prenatal Vit-Fe Fumarate-FA (PRENATAL MULTIVITAMIN) TABS tablet, Take 1 tablet by mouth daily at 12 noon.   Reviewed prior external information including notes and imaging from  primary care provider As well as notes that were available from care everywhere and other healthcare systems.  Past medical history, social, surgical and family history all reviewed in electronic medical record.  No pertanent information unless stated regarding to the chief complaint.   Review of Systems:  No headache, visual changes, nausea, vomiting, diarrhea, constipation, dizziness, abdominal pain, skin rash, fevers, chills, night sweats, weight loss, swollen lymph nodes, , joint swelling, chest pain, mood changes. POSITIVE muscle aches, body aches, shortness of breath  Objective  Blood pressure 110/64, pulse (!) 56, height 5\' 2"  (1.575 m), SpO2 99 %, unknown if currently breastfeeding.   General: No apparent distress alert and oriented x3 mood and affect normal, dressed appropriately.  HEENT: Pupils equal, extraocular movements intact  Respiratory: Patient's speak in full sentences but does seem to be in severe amount of pain when she does cough.  When listening to patient patient still has significant decrease in any type of air movement in the right middle and right lower lobes.  No wheezing noted. Cardiovascular: No lower extremity edema, non tender, no erythema  Severely tender to palpation on the right side of the upper thoracic back.  No bruising noted though.     Impression and Recommendations:    The above documentation has been reviewed and is accurate and complete Lyndal Pulley, DO

## 2021-12-15 NOTE — Assessment & Plan Note (Signed)
On the patient's x-rays do show that patient does have a T5 nondisplaced rib fracture posteriorly.  This is consistent with patient is having the most pain.  Patient still has some mild findings in the middle lobe showing that patient does have a small pneumonia as well.  We discussed doxycycline as well.  Follow up 4 weeks tramadol given for some breakthrough pain as well.  Toradol injection given today as well that hopefully will be beneficial as well.

## 2021-12-15 NOTE — Patient Instructions (Signed)
Hold ibuprofen today Albuterol inhaler use every 4 hours if needed Doxycycline prescribed Tramadol with Tylenol if needed Keep me updated, if worse go to ER

## 2022-02-14 ENCOUNTER — Other Ambulatory Visit (HOSPITAL_COMMUNITY): Payer: Self-pay | Admitting: Vascular Surgery

## 2022-02-14 DIAGNOSIS — I8390 Asymptomatic varicose veins of unspecified lower extremity: Secondary | ICD-10-CM

## 2022-02-16 ENCOUNTER — Other Ambulatory Visit (HOSPITAL_COMMUNITY): Payer: Self-pay

## 2022-02-16 MED ORDER — DOXYCYCLINE HYCLATE 100 MG PO TABS
100.0000 mg | ORAL_TABLET | Freq: Two times a day (BID) | ORAL | 0 refills | Status: AC
  Filled 2022-02-16: qty 14, 7d supply, fill #0

## 2022-02-17 ENCOUNTER — Other Ambulatory Visit (HOSPITAL_COMMUNITY): Payer: Self-pay

## 2022-03-03 ENCOUNTER — Ambulatory Visit (HOSPITAL_COMMUNITY)
Admission: RE | Admit: 2022-03-03 | Discharge: 2022-03-03 | Disposition: A | Discharge status: Home / Self Care | Payer: 59 | Source: Ambulatory Visit | Attending: Vascular Surgery | Admitting: Vascular Surgery

## 2022-03-03 DIAGNOSIS — I8390 Asymptomatic varicose veins of unspecified lower extremity: Secondary | ICD-10-CM

## 2022-04-04 DIAGNOSIS — Z30432 Encounter for removal of intrauterine contraceptive device: Secondary | ICD-10-CM | POA: Diagnosis not present

## 2022-04-04 DIAGNOSIS — R87810 Cervical high risk human papillomavirus (HPV) DNA test positive: Secondary | ICD-10-CM | POA: Diagnosis not present

## 2022-06-24 ENCOUNTER — Other Ambulatory Visit: Payer: Self-pay | Admitting: Internal Medicine

## 2022-06-24 ENCOUNTER — Other Ambulatory Visit (HOSPITAL_COMMUNITY): Payer: Self-pay | Admitting: Internal Medicine

## 2022-06-24 DIAGNOSIS — J019 Acute sinusitis, unspecified: Secondary | ICD-10-CM

## 2022-06-24 MED ORDER — AMOXICILLIN-POT CLAVULANATE 875-125 MG PO TABS: 1.0000 | ORAL_TABLET | Freq: Two times a day (BID) | ORAL | Status: AC

## 2022-06-24 NOTE — Progress Notes (Signed)
Error

## 2023-02-12 IMAGING — US US PELVIS LIMITED
1 series · 14 of 20 positions shown · non-contrast
Comparison: None.

CLINICAL DATA: Palpable left groin mass. Patient is 31 weeks
pregnant.

EXAM:
ULTRASOUND OF left GROIN SOFT TISSUES
TECHNIQUE: Ultrasound examination of the groin soft tissues was performed in
the area of clinical concern.

[Series 1: us pelvis limited (transabdominal only) · 14 of 20 slices shown]
[im 1/20]
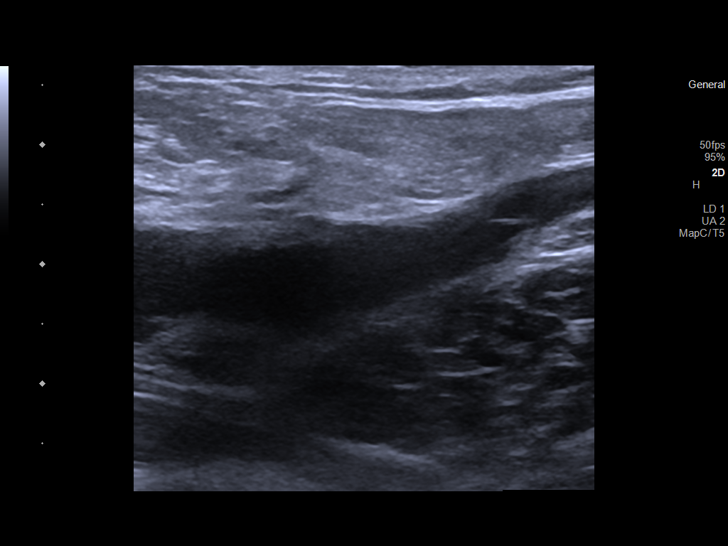
[im 3/20]
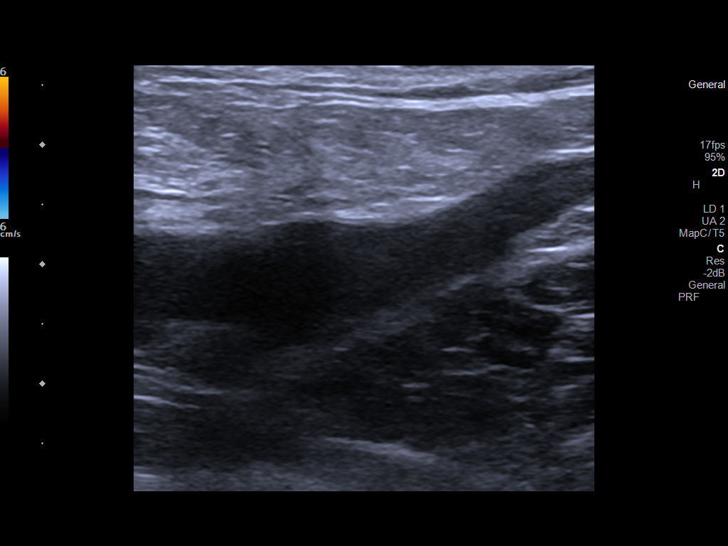
[im 4/20]
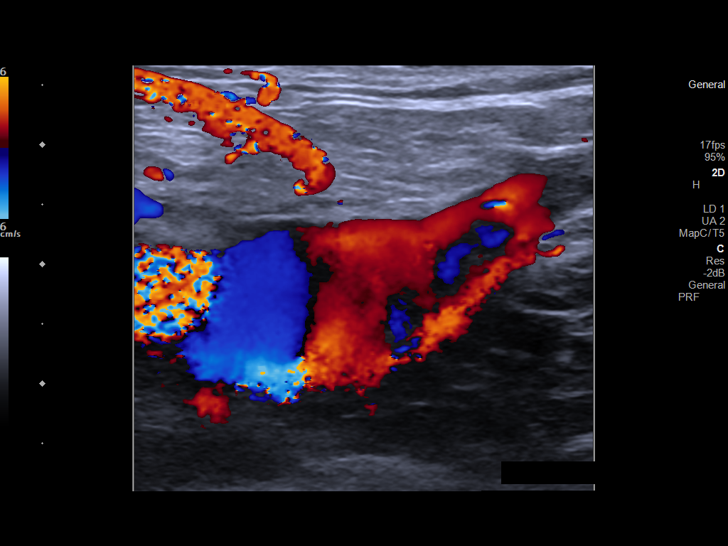
[im 6/20]
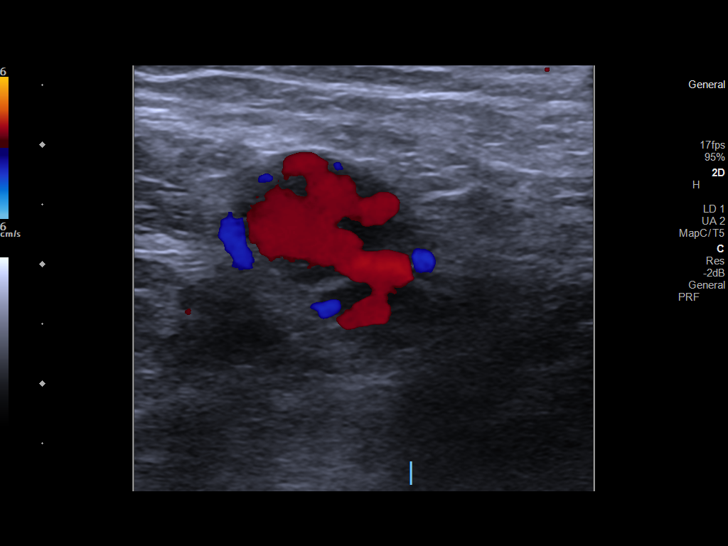
[im 7/20]
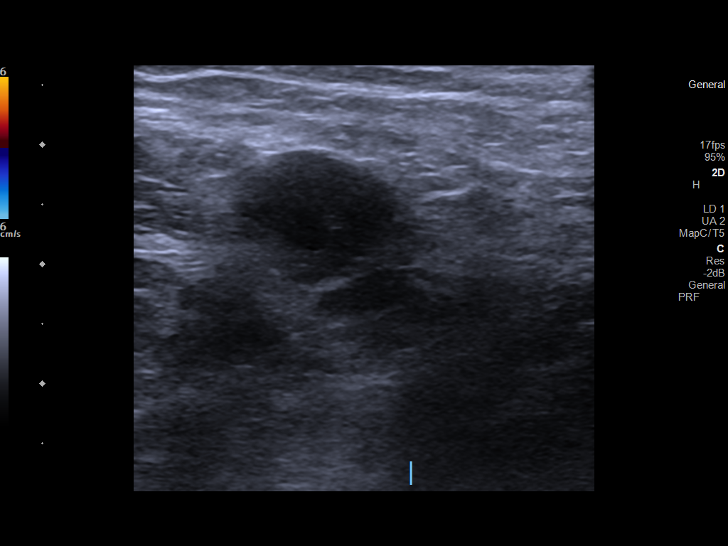
[im 8/20]
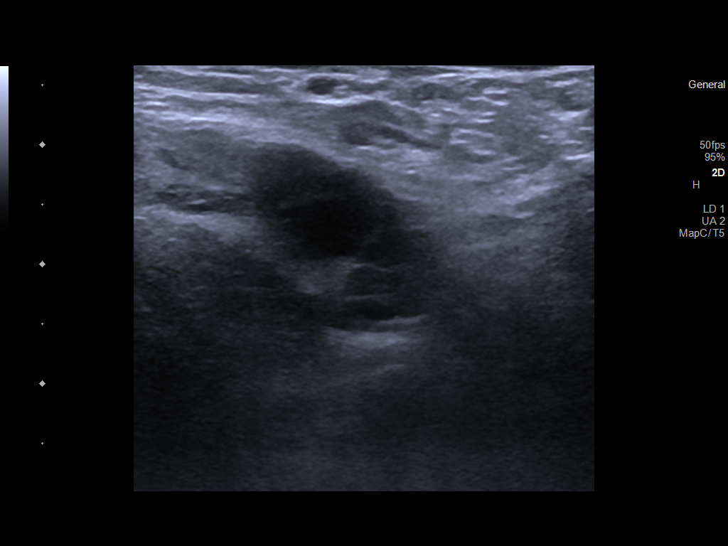
[im 10/20]
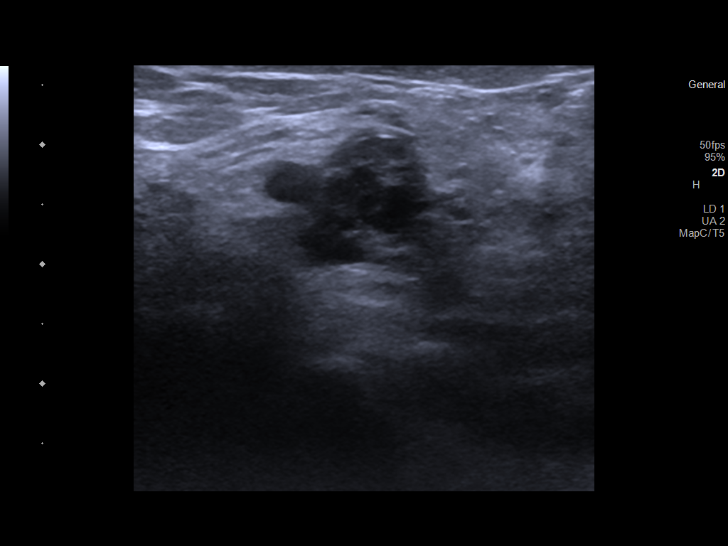
[im 11/20]
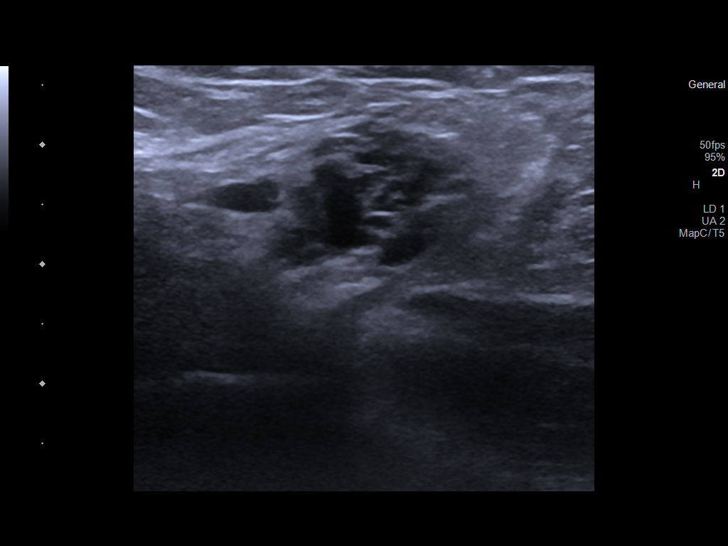
[im 13/20]
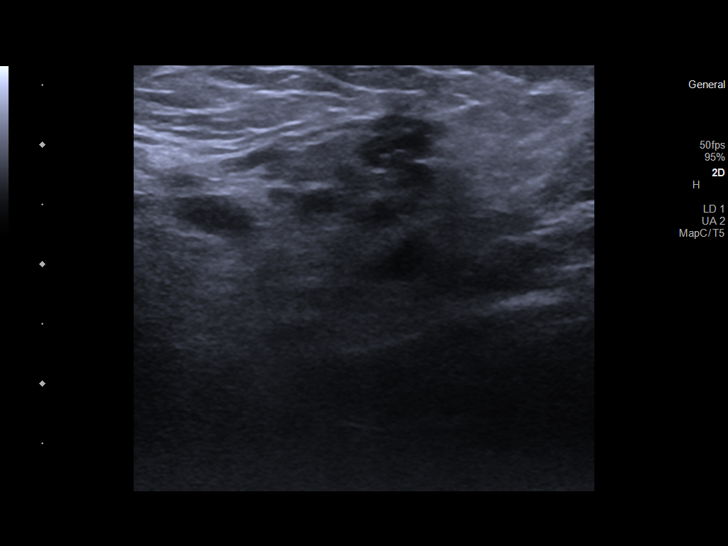
[im 14/20]
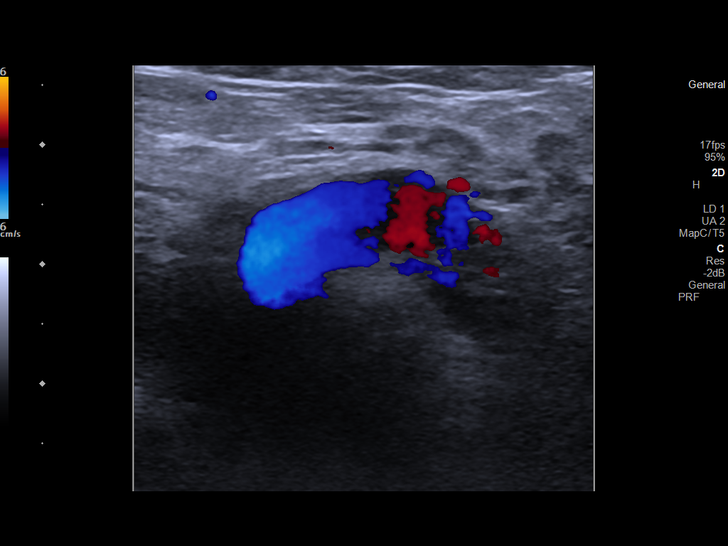
[im 16/20]
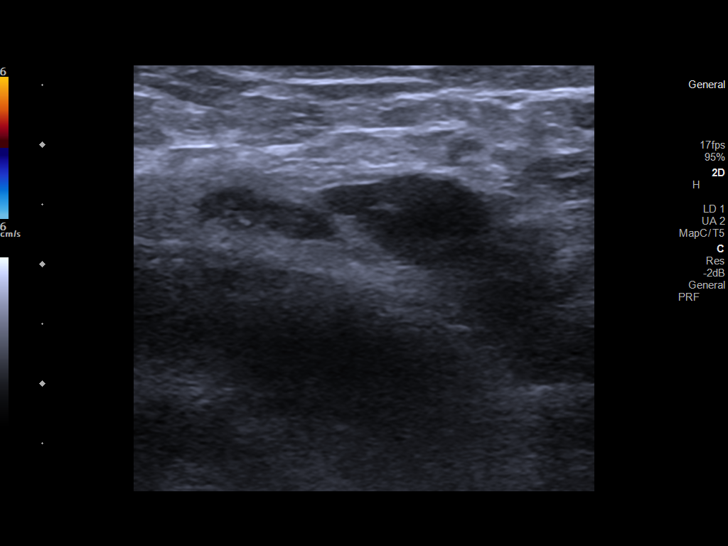
[im 17/20]
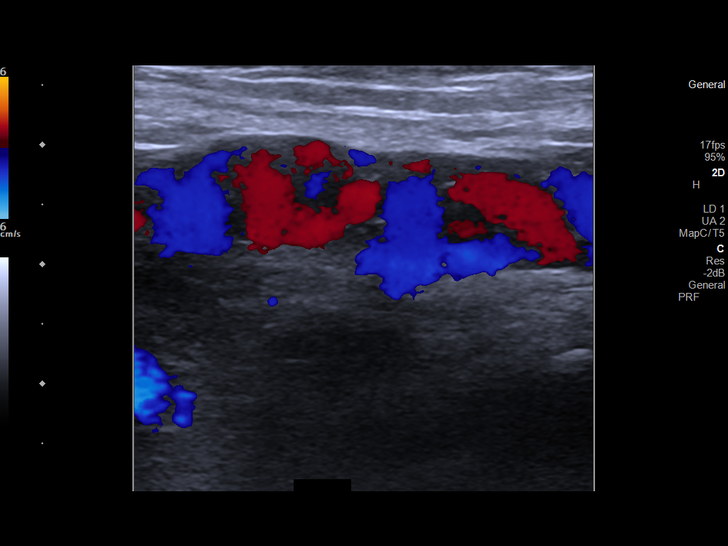
[im 18/20]
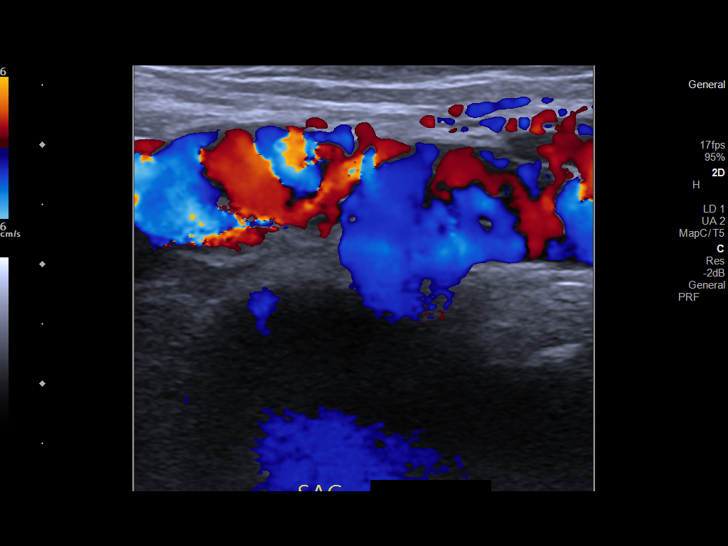
[im 20/20]
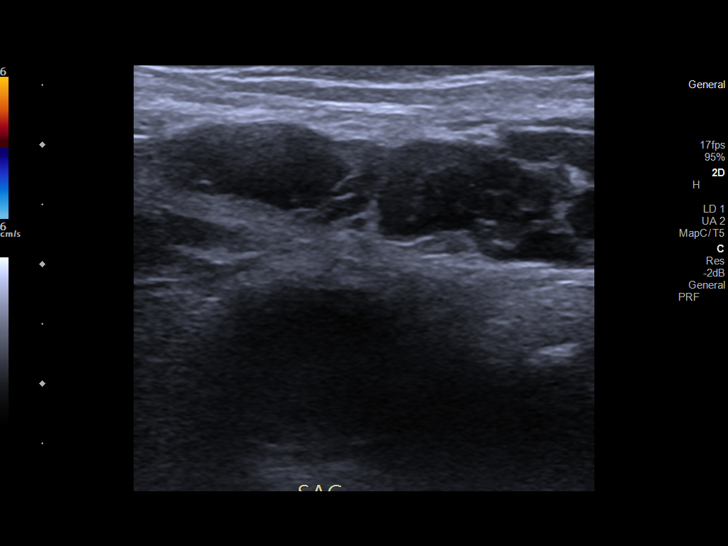

[14 of 20 positions shown; findings below may reference images not displayed]

FINDINGS: The patient's palpable abnormality corresponds to a large tangle of
vessels in the left groin area. This increases in size with
Valsalva. Suspect a venous varix or possible AV fistula. Patient may
need further evaluation with CT after delivery.
IMPRESSION: Palpable abnormality is a vascular lesion likely some type of AV
shunting or venous varix.

## 2023-02-25 IMAGING — US US MFM OB DETAIL+14 WK
1 series · 6 of 28 positions shown · non-contrast
Comparison: none

[Series 1: us mfm ob detail+14 wk · 84 acquisitions, 6 frames shown]
[im 7/84]
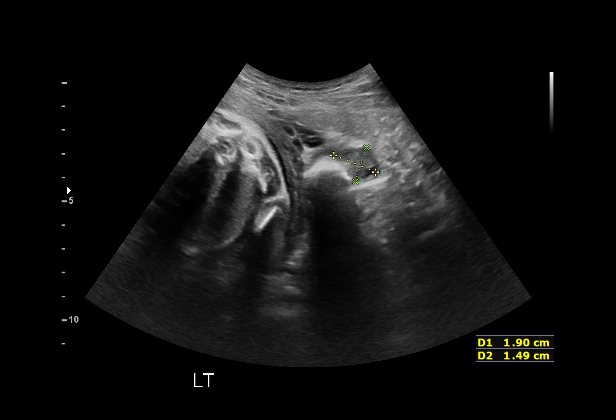
[im 22/84]
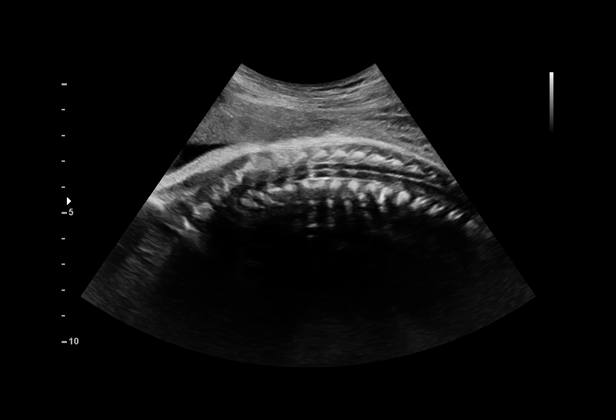
[im 34/84]
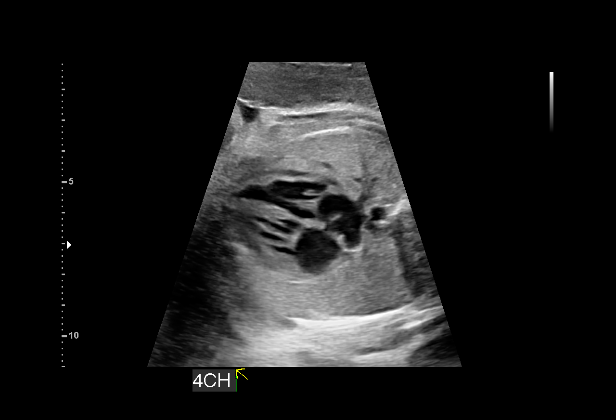
[im 50/84]
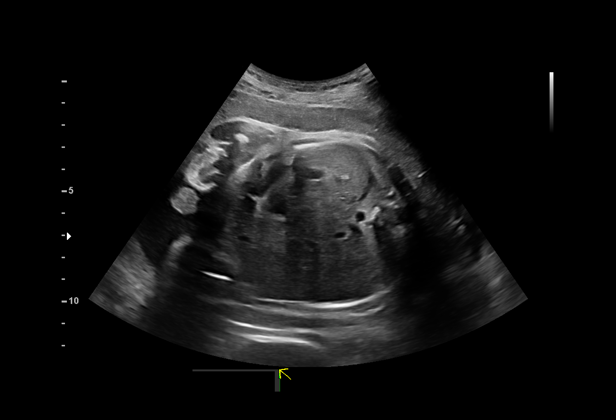
[im 65/84]
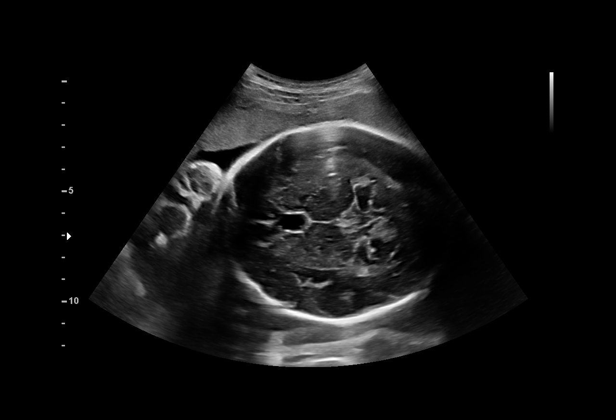
[im 77/84]
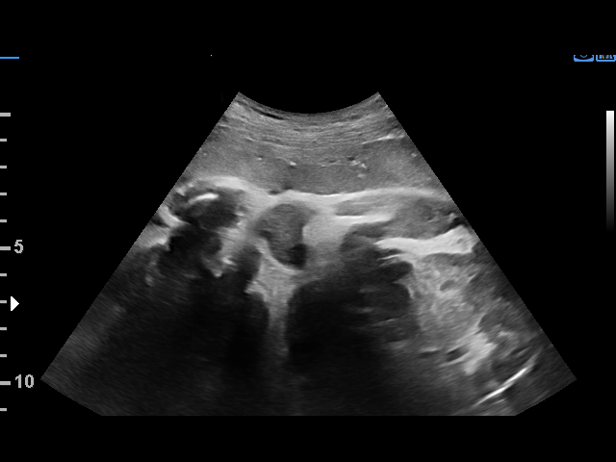

[6 of 28 positions shown; findings below may reference images not displayed]

Addendum:\.br----------------------------------------------------------------------
----------------------------------------------------------------------

----------------------------------------------------------------------

----------------------------------------------------------------------

 1  US MFM OB DETAIL +14 WK               76811.01    TSION ISAIS
 3  US MFM UA CORD DOPPLER                76820.02    TSION ISAIS
----------------------------------------------------------------------

----------------------------------------------------------------------
Indications

 Maternal care for known or suspected poor
 fetal growth, third trimester, not applicable or
 unspecified IUGR
 35 weeks gestation of pregnancy
 Encounter for antenatal screening for
 malformations
 LR NIPS, Neg Horizon
----------------------------------------------------------------------
Fetal Evaluation

 Num Of Fetuses:         1
 Fetal Heart Rate(bpm):  126
 Cardiac Activity:       Observed
 Presentation:           Breech
 Placenta:               Anterior
 P. Cord Insertion:      Visualized, central

 Amniotic Fluid
 AFI FV:      Within normal limits

 AFI Sum(cm)     %Tile       Largest Pocket(cm)
 14.13           51
 RUQ(cm)       RLQ(cm)       LUQ(cm)        LLQ(cm)

----------------------------------------------------------------------
Biophysical Evaluation

 Amniotic F.V:   Within normal limits       F. Tone:        Observed
 F. Movement:    Observed                   Score:          [DATE]
 F. Breathing:   Observed
----------------------------------------------------------------------
Biometry

 BPD:      89.7  mm     G. Age:  36w 2d         85  %    CI:         77.6   %    70 - 86
                                                         FL/HC:      19.8   %    20.1 -
 HC:      322.3  mm     G. Age:  36w 3d         51  %    HC/AC:      1.12        0.93 -
 AC:      288.2  mm     G. Age:  32w 6d          7  %    FL/BPD:     71.2   %    71 - 87
 FL:       63.9  mm     G. Age:  33w 0d          5  %    FL/AC:      22.2   %    20 - 24
 HUM:      53.9  mm     G. Age:  31w 2d        < 5  %
 CER:      45.5  mm     G. Age:  35w 0d         35  %
 LV:        3.5  mm
 CM:        7.7  mm

 Est. FW:    3339  gm    4 lb 14 oz      13  %
----------------------------------------------------------------------
OB History

 Gravidity:    1
 Living:       0
----------------------------------------------------------------------
Gestational Age

 LMP:           35w 0d        Date:  01/27/20                 EDD:   11/02/20
 U/S Today:     34w 5d                                        EDD:   11/04/20
 Best:          35w 0d     Det. By:  LMP  (01/27/20)          EDD:   11/02/20
----------------------------------------------------------------------
Anatomy

 Cranium:               Appears normal         Aortic Arch:            Appears normal
 Cavum:                 Appears normal         Ductal Arch:            Appears normal
 Ventricles:            Appears normal         Diaphragm:              Appears normal
 Choroid Plexus:        Appears normal         Stomach:                Appears normal, left
                                                                       sided
 Cerebellum:            Appears normal         Abdomen:                Appears normal
 Posterior Fossa:       Appears normal         Abdominal Wall:         Appears nml (cord
                                                                       insert, abd wall)
 Nuchal Fold:           Not applicable (>20    Cord Vessels:           Appears normal (3
                        wks GA)                                        vessel cord)
 Face:                  Appears normal         Kidneys:                Appear normal
                        (orbits and profile)
 Lips:                  Appears normal         Bladder:                Appears normal
 Thoracic:              Appears normal         Spine:                  Ltd views no
                                                                       intracranial signs of
                                                                       NTD
 Heart:                 Appears normal         Upper Extremities:      Not well visualized
                        (4CH, axis, and
                        situs)
 RVOT:                  Appears normal         Lower Extremities:      Appears normal
 LVOT:                  Appears normal

 Other:  Heels and 5th digit not well visualized. VC, 3VV and 3VTV visualized.
         Fetus appears to be a male.
----------------------------------------------------------------------
Doppler - Fetal Vessels

 Umbilical Artery
  S/D     %tile      RI    %tile      PI    %tile     PSV    ADFV    RDFV
                                                    (cm/s)
   3.6       95    0.72       95    1.[REDACTED]      No      No

----------------------------------------------------------------------
Cervix Uterus Adnexa

 Cervix
 Not visualized (advanced GA >32wks)

 Right Ovary
 Visualized.

 Left Ovary
 Visualized.
----------------------------------------------------------------------
Impression

 On today's ultrasound, amniotic fluid is normal and good fetal
 activity seen.  Breech presentation.  The estimated fetal
 weight is at the 13th percentile and the abdominal
 circumference measurement is at the 7th percentile.
 Umbilical artery Doppler showed normal forward diastolic
 flow.  Antenatal testing is reassuring.  BPP [DATE].  Fetal
 anatomical survey appears normal but limited by advanced
 gestational age.  Intracranial structures appear and abdomen
 including liver appear normal with no evidence of
 calcifications.
 [id number]
 Consultation ([REDACTED])
 I had the pleasure of seeing Ms. Mental today at the
 [HOSPITAL]. She was accompanied by her
 husband.  She is G1 P0 at 35-weeks' gestation and is here
 for a second-opinion ultrasound and consultation.
 At your office ultrasound, severe fetal growth restriction was
 detected, and the estimated fetal weight was at the 3rd
 percentile.
 Prenatal course: On cell free fetal DNA screening, the risks of
 fetal aneuploidies are not increased.  Her blood pressures
 have been normal at prenatal visits.  She does not have
 gestational diabetes.  Mid-trimester fetal anatomical survey
 performed at your office was reported as normal.  Patient
 reports that CMV screening was negative.
 Past medical history: No history of diabetes or hypertension
 or any chronic medical conditions.
 Past surgical history: Left knee surgery (ACL repair).
 Medications: Prenatal vitamins, vitamin D supplements.
 Allergies: Sulfa (rashes).
 Social history: Denies tobacco or drug or alcohol use.  She
 has been married 1 year and her husband is in good health.
 Family history: Father had pulmonary embolism twice.
 Mother has hypothyroidism.
 GYN history: History of abnormal Pap smears (HPV) but no
 cervical surgeries.  No history of breast disease.
 Our concerns include:
 Fetal growth restriction
 -I explained that fetal growth restriction is diagnosed if the
 estimated fetal weight and/or abdominal circumference
 measurement falls below the 10th percentile.
 -It is difficult to differentiate a constitutionally small fetus from
 fetal growth restriction.  Patient's BMI is 19.9.
 -Placental insufficiency is the most common cause of fetal
 growth restriction.  Other causes include fetal chromosomal
 anomaly or genetic syndromes, maternal infections (rare) and
 maternal medical conditions.
 -Regardless of diagnosis of fetal growth restriction, we
 recommend weekly antenatal testing from now till delivery.
 -Timing of delivery: Provided antenatal testing remains
 reassuring, we recommend delivery at 38- or 39-weeks'
 gestation.  Patient has an option to deliver at 38 weeks
 gestation.  Delivery at 39 weeks as opposed to 38 weeks
 gestation slightly decreases neonatal respiratory distress
 syndrome or NICU admission.
 -Vaginal delivery is not contraindicated.  Oxytocin infusion
 may be given first to assess fetal status before resorting to
 prostaglandins.
 After counseling, patient informed that she would consider
 delivery at 38 weeks gestation.  She prefers weekly antenatal
 testing to be performed at your office.
----------------------------------------------------------------------
Recommendations

 -Weekly BPP and UA Doppler till delivery at your office.
 -Delivery at 38 weeks gestation.
 -Earlier delivery to be considered if abnormal Doppler studies
 of detected.
 -Oxytocin infusion may be considered first to assess fetal
 status for induction of labor before prostaglandins.
----------------------------------------------------------------------
                      Montalbano, Bastien
----------------------------------------------------------------------

*** End of Addendum ***\.br----------------------------------------------------------------------
----------------------------------------------------------------------

----------------------------------------------------------------------

----------------------------------------------------------------------

 1  US MFM OB DETAIL +14 WK               76811.01    TSION ISAIS
----------------------------------------------------------------------

----------------------------------------------------------------------
Indications

 Maternal care for known or suspected poor
 fetal growth, third trimester, not applicable or
 unspecified IUGR
 35 weeks gestation of pregnancy
 Encounter for antenatal screening for
 malformations
 LR NIPS, Neg Horizon
----------------------------------------------------------------------
Fetal Evaluation

 Num Of Fetuses:         1
 Fetal Heart Rate(bpm):  126
 Cardiac Activity:       Observed
 Presentation:           Breech
 Placenta:               Anterior
 P. Cord Insertion:      Visualized, central

 Amniotic Fluid
 AFI FV:      Within normal limits

 AFI Sum(cm)     %Tile       Largest Pocket(cm)
 14.13           51

 RUQ(cm)       RLQ(cm)       LUQ(cm)        LLQ(cm)

----------------------------------------------------------------------
Biophysical Evaluation
 Amniotic F.V:   Within normal limits       F. Tone:        Observed
 F. Movement:    Observed                   Score:          [DATE]
 F. Breathing:   Observed
----------------------------------------------------------------------
Biometry

 BPD:      89.7  mm     G. Age:  36w 2d         85  %    CI:         77.6   %    70 - 86
                                                         FL/HC:      19.8   %    20.1 -
 HC:      322.3  mm     G. Age:  36w 3d         51  %    HC/AC:      1.12        0.93 -
 AC:      288.2  mm     G. Age:  32w 6d          7  %    FL/BPD:     71.2   %    71 - 87
 FL:       63.9  mm     G. Age:  33w 0d          5  %    FL/AC:      22.2   %    20 - 24
 HUM:      53.9  mm     G. Age:  31w 2d        < 5  %
 CER:      45.5  mm     G. Age:  35w 0d         35  %

 LV:        3.5  mm
 CM:        7.7  mm

 Est. FW:    3339  gm    4 lb 14 oz      13  %
----------------------------------------------------------------------
OB History

 Gravidity:    1
 Living:       0
----------------------------------------------------------------------
Gestational Age

 LMP:           35w 0d        Date:  01/27/20                 EDD:   11/02/20
 U/S Today:     34w 5d                                        EDD:   11/04/20
 Best:          35w 0d     Det. By:  LMP  (01/27/20)          EDD:   11/02/20
----------------------------------------------------------------------
Anatomy

 Cranium:               Appears normal         Aortic Arch:            Appears normal
 Cavum:                 Appears normal         Ductal Arch:            Appears normal
 Ventricles:            Appears normal         Diaphragm:              Appears normal
 Choroid Plexus:        Appears normal         Stomach:                Appears normal, left
                                                                       sided
 Cerebellum:            Appears normal         Abdomen:                Appears normal
 Posterior Fossa:       Appears normal         Abdominal Wall:         Appears nml (cord
                                                                       insert, abd wall)
 Nuchal Fold:           Not applicable (>20    Cord Vessels:           Appears normal (3
                        wks GA)                                        vessel cord)
 Face:                  Appears normal         Kidneys:                Appear normal
                        (orbits and profile)
 Lips:                  Appears normal         Bladder:                Appears normal
 Thoracic:              Appears normal         Spine:                  Ltd views no
                                                                       intracranial signs of
                                                                       NTD
 Heart:                 Appears normal         Upper Extremities:      Not well visualized
                        (4CH, axis, and
                        situs)
 RVOT:                  Appears normal         Lower Extremities:      Appears normal
 LVOT:                  Appears normal

 Other:  Heels and 5th digit not well visualized. VC, 3VV and 3VTV visualized.
         Fetus appears to be a male.
----------------------------------------------------------------------
Doppler - Fetal Vessels
 Umbilical Artery
  S/D     %tile      RI    %tile      PI    %tile     PSV    ADFV    RDFV
                                                    (cm/s)
   3.6       95    0.72       95    1.[REDACTED]      No      No

----------------------------------------------------------------------
Cervix Uterus Adnexa

 Cervix
 Not visualized (advanced GA >32wks)

 Right Ovary
 Visualized.

 Left Ovary
 Visualized.
----------------------------------------------------------------------
Impression

 On today's ultrasound, amniotic fluid is normal and good fetal
 activity seen.  Breech presentation.  The estimated fetal
 weight is at the 13th percentile and the abdominal
 circumference measurement is at the 7th percentile.
 Umbilical artery Doppler showed normal forward diastolic
 flow.  Antenatal testing is reassuring.  BPP [DATE].  Fetal
 anatomical survey appears normal but limited by advanced
 gestational age.  Intracranial structures appear and abdomen
 including liver appear normal with no evidence of
 calcifications.
 [id number]
 Consultation ([REDACTED])
 I had the pleasure of seeing Ms. Mental today at the
 [HOSPITAL]. She was accompanied by her
 husband.  She is G1 P0 at 35-weeks' gestation and is here
 for a second-opinion ultrasound and consultation.
 At your office ultrasound, severe fetal growth restriction was
 detected, and the estimated fetal weight was at the 3rd
 percentile.
 Prenatal course: On cell free fetal DNA screening, the risks of
 fetal aneuploidies are not increased.  Her blood pressures
 have been normal at prenatal visits.  She does not have
 gestational diabetes.  Mid-trimester fetal anatomical survey
 performed at your office was reported as normal.  Patient
 reports that CMV screening was negative.
 Past medical history: No history of diabetes or hypertension
 or any chronic medical conditions.
 Past surgical history: Left knee surgery (ACL repair).
 Medications: Prenatal vitamins, vitamin D supplements.
 Allergies: Sulfa (rashes).
 Social history: Denies tobacco or drug or alcohol use.  She
 has been married 1 year and her husband is in good health.
 Family history: Father had pulmonary embolism twice.
 Mother has hypothyroidism.
 GYN history: History of abnormal Pap smears (HPV) but no
 cervical surgeries.  No history of breast disease.
 Our concerns include:
 Fetal growth restriction
 -I explained that fetal growth restriction is diagnosed if the
 estimated fetal weight and/or abdominal circumference
 measurement falls below the 10th percentile.
 -It is difficult to differentiate a constitutionally small fetus from
 fetal growth restriction.  Patient's BMI is 19.9.
 -Placental insufficiency is the most common cause of fetal
 growth restriction.  Other causes include fetal chromosomal
 anomaly or genetic syndromes, maternal infections (rare) and
 maternal medical conditions.
 -Regardless of diagnosis of fetal growth restriction, we
 recommend weekly antenatal testing from now till delivery.
 -Timing of delivery: Provided antenatal testing remains
 reassuring, we recommend delivery at 38- or 39-weeks'
 gestation.  Patient has an option to deliver at 38 weeks
 gestation.  Delivery at 39 weeks as opposed to 38 weeks
 gestation slightly decreases neonatal respiratory distress
 syndrome or NICU admission.
 -Vaginal delivery is not contraindicated.  Oxytocin infusion
 may be given first to assess fetal status before resorting to
 prostaglandins.
 After counseling, patient informed that she would consider
 delivery at 38 weeks gestation.  She prefers weekly antenatal
 testing to be performed at your office.
----------------------------------------------------------------------
Recommendations

 -Weekly BPP and UA Doppler till delivery at your office.
 -Delivery at 38 weeks gestation.
 -Earlier delivery to be considered if abnormal Doppler studies
 of detected.
 -Oxytocin infusion may be considered first to assess fetal
 status for induction of labor before prostaglandins.
----------------------------------------------------------------------
                 Montalbano, Bastien
----------------------------------------------------------------------

## 2023-12-03 ENCOUNTER — Other Ambulatory Visit (HOSPITAL_BASED_OUTPATIENT_CLINIC_OR_DEPARTMENT_OTHER): Payer: Self-pay
